# Patient Record
Sex: Female | Born: 1990 | Race: Black or African American | Hispanic: No | Marital: Single | Smoking: Former smoker
Health system: Southern US, Community
[De-identification: ages and names within clinical notes are randomized; demographics above are authoritative.]

## PROBLEM LIST (undated history)

## (undated) ENCOUNTER — Inpatient Hospital Stay (HOSPITAL_COMMUNITY): Payer: Self-pay

## (undated) DIAGNOSIS — R87629 Unspecified abnormal cytological findings in specimens from vagina: Secondary | ICD-10-CM

## (undated) DIAGNOSIS — K8020 Calculus of gallbladder without cholecystitis without obstruction: Secondary | ICD-10-CM

## (undated) DIAGNOSIS — B977 Papillomavirus as the cause of diseases classified elsewhere: Secondary | ICD-10-CM

## (undated) DIAGNOSIS — K649 Unspecified hemorrhoids: Secondary | ICD-10-CM

## (undated) DIAGNOSIS — A599 Trichomoniasis, unspecified: Secondary | ICD-10-CM

## (undated) DIAGNOSIS — I10 Essential (primary) hypertension: Secondary | ICD-10-CM

## (undated) DIAGNOSIS — D649 Anemia, unspecified: Secondary | ICD-10-CM

## (undated) HISTORY — DX: Essential (primary) hypertension: I10

## (undated) HISTORY — PX: EXCISION VAGINAL CYST: SHX5825

---

## 2012-03-13 DIAGNOSIS — B977 Papillomavirus as the cause of diseases classified elsewhere: Secondary | ICD-10-CM

## 2012-03-13 HISTORY — DX: Papillomavirus as the cause of diseases classified elsewhere: B97.7

## 2013-04-05 ENCOUNTER — Emergency Department (HOSPITAL_COMMUNITY)
Admission: EM | Admit: 2013-04-05 | Discharge: 2013-04-05 | Disposition: A | Discharge status: Home / Self Care | Payer: Self-pay | Attending: Emergency Medicine | Admitting: Emergency Medicine

## 2013-04-05 ENCOUNTER — Emergency Department (HOSPITAL_COMMUNITY): Payer: Self-pay

## 2013-04-05 ENCOUNTER — Encounter (HOSPITAL_COMMUNITY): Payer: Self-pay | Admitting: Emergency Medicine

## 2013-04-05 DIAGNOSIS — R109 Unspecified abdominal pain: Secondary | ICD-10-CM

## 2013-04-05 DIAGNOSIS — N760 Acute vaginitis: Secondary | ICD-10-CM | POA: Insufficient documentation

## 2013-04-05 DIAGNOSIS — Z3202 Encounter for pregnancy test, result negative: Secondary | ICD-10-CM | POA: Insufficient documentation

## 2013-04-05 DIAGNOSIS — R1031 Right lower quadrant pain: Secondary | ICD-10-CM | POA: Insufficient documentation

## 2013-04-05 DIAGNOSIS — A499 Bacterial infection, unspecified: Secondary | ICD-10-CM | POA: Insufficient documentation

## 2013-04-05 DIAGNOSIS — Z8719 Personal history of other diseases of the digestive system: Secondary | ICD-10-CM | POA: Insufficient documentation

## 2013-04-05 DIAGNOSIS — B9689 Other specified bacterial agents as the cause of diseases classified elsewhere: Secondary | ICD-10-CM | POA: Insufficient documentation

## 2013-04-05 DIAGNOSIS — R509 Fever, unspecified: Secondary | ICD-10-CM | POA: Insufficient documentation

## 2013-04-05 HISTORY — DX: Calculus of gallbladder without cholecystitis without obstruction: K80.20

## 2013-04-05 LAB — CBC WITH DIFFERENTIAL/PLATELET
BASOS ABS: 0 10*3/uL (ref 0.0–0.1)
Basophils Relative: 0 % (ref 0–1)
Eosinophils Absolute: 0.1 10*3/uL (ref 0.0–0.7)
Eosinophils Relative: 1 % (ref 0–5)
HCT: 35.6 % — ABNORMAL LOW (ref 36.0–46.0)
Hemoglobin: 12.1 g/dL (ref 12.0–15.0)
LYMPHS ABS: 3.1 10*3/uL (ref 0.7–4.0)
LYMPHS PCT: 29 % (ref 12–46)
MCH: 31.2 pg (ref 26.0–34.0)
MCHC: 34 g/dL (ref 30.0–36.0)
MCV: 91.8 fL (ref 78.0–100.0)
Monocytes Absolute: 0.6 10*3/uL (ref 0.1–1.0)
Monocytes Relative: 6 % (ref 3–12)
NEUTROS ABS: 6.9 10*3/uL (ref 1.7–7.7)
Neutrophils Relative %: 64 % (ref 43–77)
PLATELETS: 312 10*3/uL (ref 150–400)
RBC: 3.88 MIL/uL (ref 3.87–5.11)
RDW: 14.1 % (ref 11.5–15.5)
WBC: 10.7 10*3/uL — ABNORMAL HIGH (ref 4.0–10.5)

## 2013-04-05 LAB — URINE MICROSCOPIC-ADD ON

## 2013-04-05 LAB — URINALYSIS, ROUTINE W REFLEX MICROSCOPIC
BILIRUBIN URINE: NEGATIVE
GLUCOSE, UA: NEGATIVE mg/dL
Ketones, ur: NEGATIVE mg/dL
Nitrite: NEGATIVE
PROTEIN: NEGATIVE mg/dL
Specific Gravity, Urine: 1.035 — ABNORMAL HIGH (ref 1.005–1.030)
Urobilinogen, UA: 0.2 mg/dL (ref 0.0–1.0)
pH: 5.5 (ref 5.0–8.0)

## 2013-04-05 LAB — COMPREHENSIVE METABOLIC PANEL
ALT: 13 U/L (ref 0–35)
AST: 19 U/L (ref 0–37)
Albumin: 3.4 g/dL — ABNORMAL LOW (ref 3.5–5.2)
Alkaline Phosphatase: 95 U/L (ref 39–117)
BILIRUBIN TOTAL: 0.3 mg/dL (ref 0.3–1.2)
BUN: 14 mg/dL (ref 6–23)
CALCIUM: 8.5 mg/dL (ref 8.4–10.5)
CHLORIDE: 104 meq/L (ref 96–112)
CO2: 24 meq/L (ref 19–32)
Creatinine, Ser: 0.72 mg/dL (ref 0.50–1.10)
GFR calc non Af Amer: >90 mL/min (ref >90)
GLUCOSE: 108 mg/dL — AB (ref 70–99)
POTASSIUM: 3.7 meq/L (ref 3.7–5.3)
Sodium: 141 mEq/L (ref 137–147)
Total Protein: 7.5 g/dL (ref 6.0–8.3)

## 2013-04-05 LAB — WET PREP, GENITAL
TRICH WET PREP: NONE SEEN
Yeast Wet Prep HPF POC: NONE SEEN

## 2013-04-05 LAB — POCT PREGNANCY, URINE: PREG TEST UR: NEGATIVE

## 2013-04-05 LAB — LIPASE, BLOOD: Lipase: 24 U/L (ref 11–59)

## 2013-04-05 MED ORDER — MORPHINE SULFATE 4 MG/ML IJ SOLN
4.0000 mg | Freq: Once | INTRAMUSCULAR | Status: AC
  Administered 2013-04-05: 4 mg via INTRAVENOUS
  Filled 2013-04-05: qty 1

## 2013-04-05 MED ORDER — LIDOCAINE HCL (PF) 1 % IJ SOLN
INTRAMUSCULAR | Status: AC
  Filled 2013-04-05: qty 5

## 2013-04-05 MED ORDER — METRONIDAZOLE 500 MG PO TABS: 500.0000 mg | ORAL_TABLET | Freq: Two times a day (BID) | ORAL | Status: DC

## 2013-04-05 MED ORDER — AZITHROMYCIN 1 G PO PACK
1.0000 g | PACK | Freq: Once | ORAL | Status: AC
  Administered 2013-04-05: 1 g via ORAL
  Filled 2013-04-05: qty 1

## 2013-04-05 MED ORDER — HYDROCODONE-ACETAMINOPHEN 5-325 MG PO TABS: 2.0000 | ORAL_TABLET | ORAL | Status: DC | PRN

## 2013-04-05 MED ORDER — ONDANSETRON HCL 4 MG/2ML IJ SOLN
4.0000 mg | Freq: Once | INTRAMUSCULAR | Status: AC
  Administered 2013-04-05: 4 mg via INTRAVENOUS
  Filled 2013-04-05: qty 2

## 2013-04-05 MED ORDER — SODIUM CHLORIDE 0.9 % IV BOLUS (SEPSIS)
1000.0000 mL | Freq: Once | INTRAVENOUS | Status: AC
  Administered 2013-04-05: 1000 mL via INTRAVENOUS

## 2013-04-05 MED ORDER — CEFTRIAXONE SODIUM 250 MG IJ SOLR
250.0000 mg | Freq: Once | INTRAMUSCULAR | Status: AC
  Administered 2013-04-05: 250 mg via INTRAMUSCULAR
  Filled 2013-04-05: qty 250

## 2013-04-05 NOTE — ED Notes (Signed)
Pt states that she started experiencing abd pain at 0400. Denies N/V/D. Pt states that she thinks something is wrong with her gallbladder.

## 2013-04-05 NOTE — ED Notes (Signed)
Patient transported to CT 

## 2013-04-05 NOTE — ED Provider Notes (Signed)
Care assumed from Dr. Lavella LemonsManly. Patient awaiting pelvic ultrasound to evaluate for ovarian torsion.  Ultrasound shows no evidence of torsion. Patient's abdomen is soft and nontender. She stable for discharge per Dr. Lavella LemonsManly for treatment for bacterial vaginosis and cervicitis.  BP 105/42  Pulse 75  Temp(Src) 100 F (37.8 C) (Oral)  Resp 18  SpO2 99%  LMP 03/31/2013   Glynn OctaveStephen Marney Treloar, MD 04/05/13 1023

## 2013-04-05 NOTE — Discharge Instructions (Signed)
Abdominal Pain, Adult °Many things can cause abdominal pain. Usually, abdominal pain is not caused by a disease and will improve without treatment. It can often be observed and treated at home. Your health care provider will do a physical exam and possibly order blood tests and X-rays to help determine the seriousness of your pain. However, in many cases, more time must pass before a clear cause of the pain can be found. Before that point, your health care provider may not know if you need more testing or further treatment. °HOME CARE INSTRUCTIONS  °Monitor your abdominal pain for any changes. The following actions may help to alleviate any discomfort you are experiencing: °· Only take over-the-counter or prescription medicines as directed by your health care provider. °· Do not take laxatives unless directed to do so by your health care provider. °· Try a clear liquid diet (broth, tea, or water) as directed by your health care provider. Slowly move to a bland diet as tolerated. °SEEK MEDICAL CARE IF: °· You have unexplained abdominal pain. °· You have abdominal pain associated with nausea or diarrhea. °· You have pain when you urinate or have a bowel movement. °· You experience abdominal pain that wakes you in the night. °· You have abdominal pain that is worsened or improved by eating food. °· You have abdominal pain that is worsened with eating fatty foods. °SEEK IMMEDIATE MEDICAL CARE IF:  °· Your pain does not go away within 2 hours. °· You have a fever. °· You keep throwing up (vomiting). °· Your pain is felt only in portions of the abdomen, such as the right side or the left lower portion of the abdomen. °· You pass bloody or black tarry stools. °MAKE SURE YOU: °· Understand these instructions.   °· Will watch your condition.   °· Will get help right away if you are not doing well or get worse.   °Document Released: 12/07/2004 Document Revised: 12/18/2012 Document Reviewed: 11/06/2012 °[employer]® Patient  Information ©2014 [employer]. ° °

## 2013-04-05 NOTE — ED Notes (Signed)
Patient transported to Ultrasound 

## 2013-04-05 NOTE — ED Provider Notes (Signed)
CSN: [id number]     Arrival date & time 04/05/13  0518 History   First MD Initiated Contact with Patient 04/05/13 [id number]     Chief Complaint  Patient presents with  . Groin Pain   (Consider location/radiation/quality/duration/timing/severity/associated sxs/prior Treatment) HPI This patient is a 23 year old woman in generally good health. She presents with complaints of right lower quadrant abdominal pain radiating to the suprapubic region. She is feeling well when she went to sleep last night. She awoke around 4 AM with pain. She denies both nausea and vomiting. She has urinated since her symptoms began and has not appreciated dysuria, hesitancy or gross hematuria.  She denies history of similar symptoms. She has no history of abdominal surgeries. She has not had diarrhea. She denies unusual vaginal discharge. Her last menstrual period ended 5 days ago.  The patient denies fever at home. She is noted to have a mildly elevated temperature here in the emergency department 100.0 Fahrenheit  Past Medical History  Diagnosis Date  . Gallstones    History reviewed. No pertinent past surgical history. No family history on file. History  Substance Use Topics  . Smoking status: Never Smoker   . Smokeless tobacco: Never Used  . Alcohol Use: Not on file     Comment: Ocassionaly   OB History   Grav Para Term Preterm Abortions TAB SAB Ect Mult Living   1 1 1       1      Review of Systems Ten point review of symptoms performed and is negative with the exception of symptoms noted above.   Allergies  Review of patient's allergies indicates no known allergies.  Home Medications  No current outpatient prescriptions on file. BP 143/55  Pulse 105  Temp(Src) 100 F (37.8 C) (Oral)  Resp 18  SpO2 100%  LMP 03/31/2013 Physical Exam Gen: well developed and well nourished appearing, tearful and appears to be in pain Head: NCAT Eyes: PERL, EOMI Nose: no epistaixis or rhinorrhea Mouth/throat:  mucosa is moist and pink Neck: supple, no stridor Lungs: Respiratory 24 permanent, CTA B, no wheezing, rhonchi or rales CV: Rapid and regular, pulse 104 beats per minute no murmur, extremities appear well perfused.  Abd: soft, tenderness over the right lower quadrant, more significant tenderness over the midline suprapubic region, nondistended Back: no ttp, no cva ttp Skin: warm and dry Ext: normal to inspection, no dependent edema Neuro: CN ii-xii grossly intact, no focal deficits Psyche; anxious appearing affect, cooperative.   ED Course  Procedures (including critical care time) Labs Review  Results for orders placed during the hospital encounter of 04/05/13 (from the past 24 hour(s))  CBC WITH DIFFERENTIAL     Status: Abnormal   Collection Time    04/05/13  5:33 AM      Result Value Range   WBC 10.7 (*) 4.0 - 10.5 K/uL   RBC 3.88  3.87 - 5.11 MIL/uL   Hemoglobin 12.1  12.0 - 15.0 g/dL   HCT 56.235.6 (*) 13.036.0 - 86.546.0 %   MCV 91.8  78.0 - 100.0 fL   MCH 31.2  26.0 - 34.0 pg   MCHC 34.0  30.0 - 36.0 g/dL   RDW 78.414.1  69.611.5 - 29.515.5 %   Platelets 312  150 - 400 K/uL   Neutrophils Relative % 64  43 - 77 %   Neutro Abs 6.9  1.7 - 7.7 K/uL   Lymphocytes Relative 29  12 - 46 %   Lymphs Abs 3.1  0.7 - 4.0 K/uL   Monocytes Relative 6  3 - 12 %   Monocytes Absolute 0.6  0.1 - 1.0 K/uL   Eosinophils Relative 1  0 - 5 %   Eosinophils Absolute 0.1  0.0 - 0.7 K/uL   Basophils Relative 0  0 - 1 %   Basophils Absolute 0.0  0.0 - 0.1 K/uL  COMPREHENSIVE METABOLIC PANEL     Status: Abnormal   Collection Time    04/05/13  5:33 AM      Result Value Range   Sodium 141  137 - 147 mEq/L   Potassium 3.7  3.7 - 5.3 mEq/L   Chloride 104  96 - 112 mEq/L   CO2 24  19 - 32 mEq/L   Glucose, Bld 108 (*) 70 - 99 mg/dL   BUN 14  6 - 23 mg/dL   Creatinine, Ser 6.57  0.50 - 1.10 mg/dL   Calcium 8.5  8.4 - 84.6 mg/dL   Total Protein 7.5  6.0 - 8.3 g/dL   Albumin 3.4 (*) 3.5 - 5.2 g/dL   AST 19  0 - 37 U/L    ALT 13  0 - 35 U/L   Alkaline Phosphatase 95  39 - 117 U/L   Total Bilirubin 0.3  0.3 - 1.2 mg/dL   GFR calc non Af Amer >90  >90 mL/min   GFR calc Af Amer >90  >90 mL/min  LIPASE, BLOOD     Status: None   Collection Time    04/05/13  5:33 AM      Result Value Range   Lipase 24  11 - 59 U/L  URINALYSIS, ROUTINE W REFLEX MICROSCOPIC     Status: Abnormal   Collection Time    04/05/13  5:41 AM      Result Value Range   Color, Urine YELLOW  YELLOW   APPearance CLOUDY (*) CLEAR   Specific Gravity, Urine 1.035 (*) 1.005 - 1.030   pH 5.5  5.0 - 8.0   Glucose, UA NEGATIVE  NEGATIVE mg/dL   Hgb urine dipstick MODERATE (*) NEGATIVE   Bilirubin Urine NEGATIVE  NEGATIVE   Ketones, ur NEGATIVE  NEGATIVE mg/dL   Protein, ur NEGATIVE  NEGATIVE mg/dL   Urobilinogen, UA 0.2  0.0 - 1.0 mg/dL   Nitrite NEGATIVE  NEGATIVE   Leukocytes, UA MODERATE (*) NEGATIVE  URINE MICROSCOPIC-ADD ON     Status: Abnormal   Collection Time    04/05/13  5:41 AM      Result Value Range   Squamous Epithelial / LPF FEW (*) RARE   WBC, UA 3-6  <3 WBC/hpf   RBC / HPF 0-2  <3 RBC/hpf   Bacteria, UA FEW (*) RARE  POCT PREGNANCY, URINE     Status: None   Collection Time    04/05/13  5:53 AM      Result Value Range   Preg Test, Ur NEGATIVE  NEGATIVE    MDM  DDX: cervicitis, PID, complication of pregnancy, ovarian torsion, appendicitis, UTI, pyelonephritis, colitis.   U/A is nondiagnostic and POC urine preg is negative. We will perform pelvic exam and proceed with plan to CT abd/pelvis and evaluate for appendicitis if pelvic exam is non-diagnostic. We are treating with ivf, analgesia and antiemetic.   0630: Pelvic exam performed. Normal female external genitalia and vulva without lesion, no vaginal discharge, endocervical swabs obtained, no CMT, no adnexal masses appreciated on bimanual exam.   [phone]: Patient says pain is better  but still significant. Rates in 7/10.   0743:  CT abdomen pelvis is wnl. Wet prep  consistent with bacterial vaginosis. The patient is feeling better.   Brandt Loosen, MD 04/05/13 [phone]

## 2013-04-06 LAB — URINE CULTURE: Colony Count: >=100000

## 2013-04-07 LAB — GC/CHLAMYDIA PROBE AMP
CT PROBE, AMP APTIMA: NEGATIVE
GC PROBE AMP APTIMA: NEGATIVE

## 2014-01-12 ENCOUNTER — Encounter (HOSPITAL_COMMUNITY): Payer: Self-pay | Admitting: Emergency Medicine

## 2015-08-14 IMAGING — US US ART/VEN ABD/PELV/SCROTUM DOPPLER LTD
1 series · 13 of 25 positions shown · non-contrast
Comparison: CT same date

ADDENDUM:
The title of this examination should also include: TRANSVAGINAL
PELVIC ULTRASOUND

In the explanation of technique, it should be specified that
transvaginal technique was utilized for improved visualization of
the uterus and adnexal structures and was specifically necessary for
improved visualization of the endometrium and ovaries.
CLINICAL DATA: Groin and pelvic pain, side not specified, evaluate
for ovarian torsion
EXAM:
TRANSABDOMINAL ULTRASOUND OF PELVIS
DOPPLER ULTRASOUND OF OVARIES
TECHNIQUE: Transabdominal ultrasound examination of the pelvis was performed
including evaluation of the uterus, ovaries, adnexal regions, and
pelvic cul-de-sac.
Color and duplex Doppler ultrasound was utilized to evaluate blood
flow to the ovaries.

[Series 1: us art/ven abd/pelv/scrotum doppler ltd · 0.22mm/px · 13 of 55 slices shown]
[im 1/55]
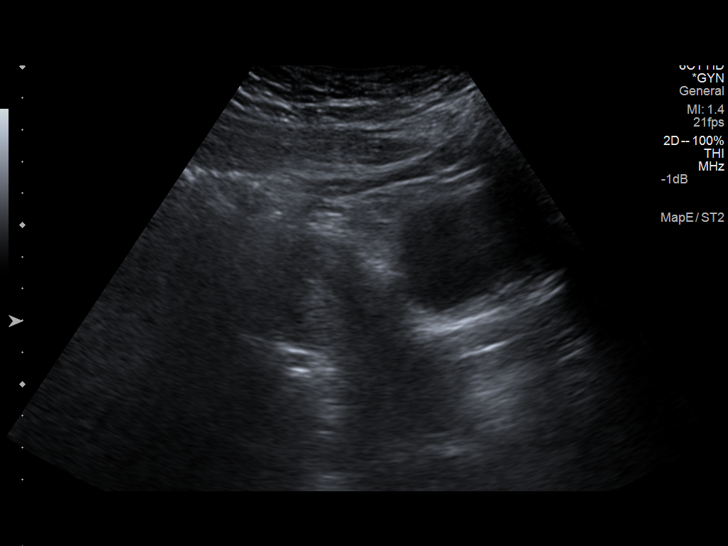
[im 5/55]
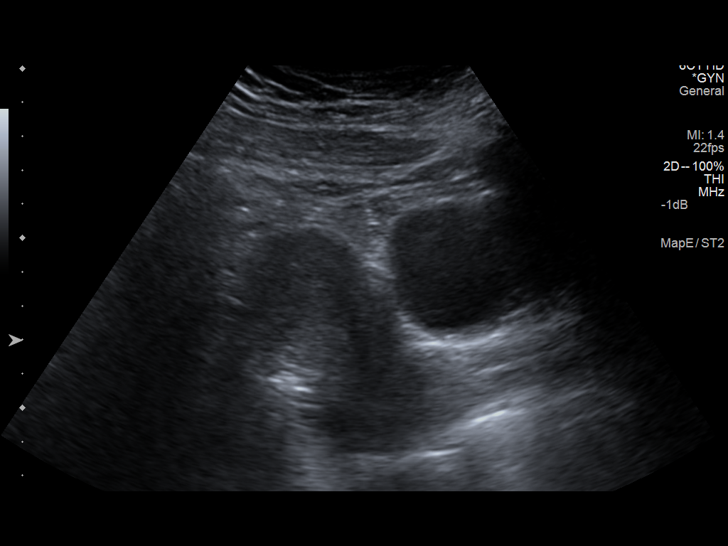
[im 10/55]
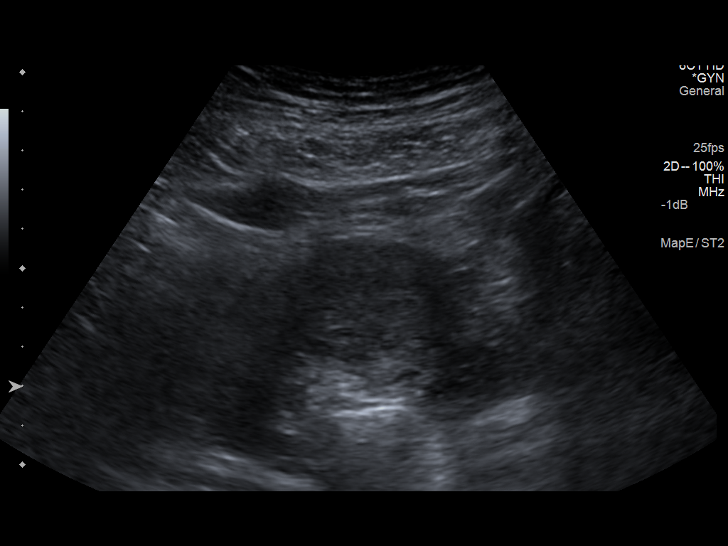
[im 14/55]
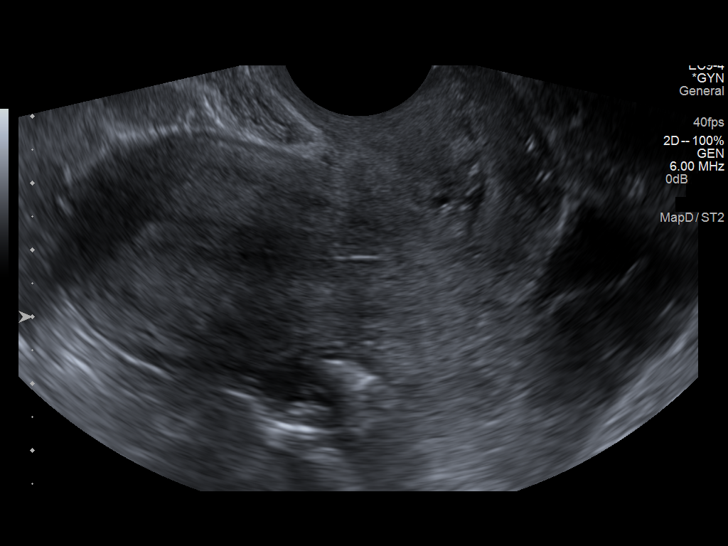
[im 19/55]
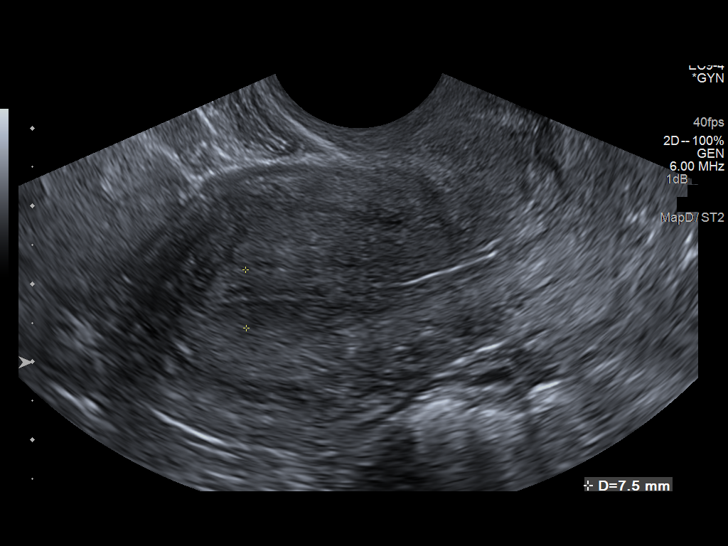
[im 23/55]
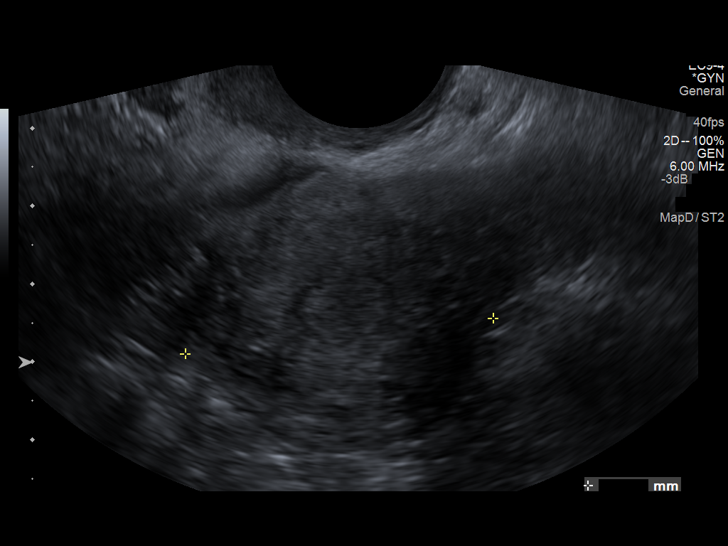
[im 28/55]
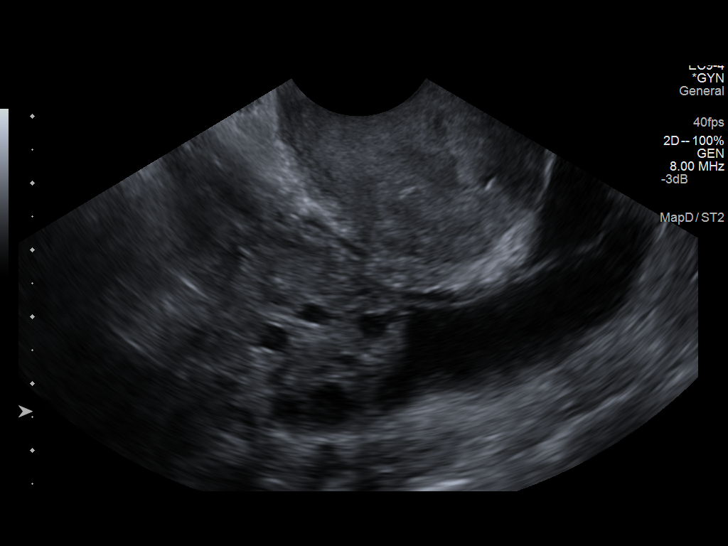
[im 32/55]
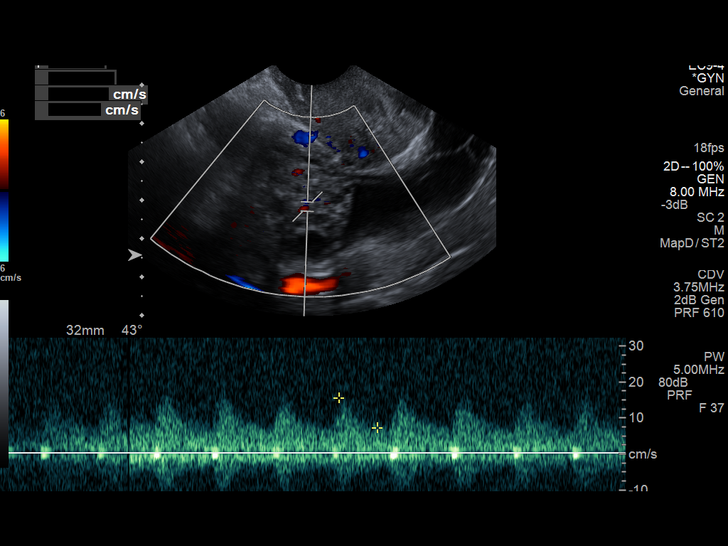
[im 37/55]
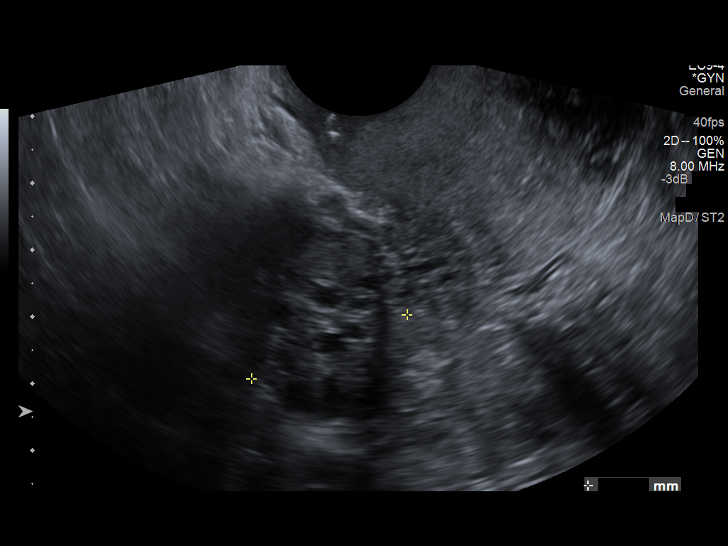
[im 41/55]
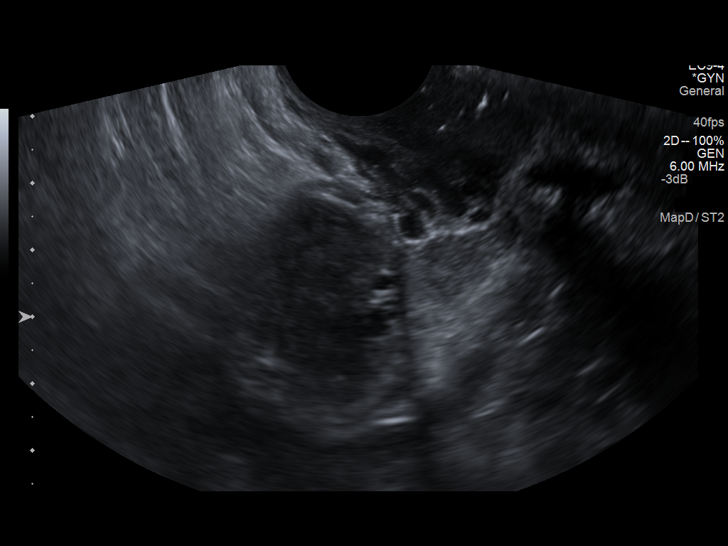
[im 46/55]
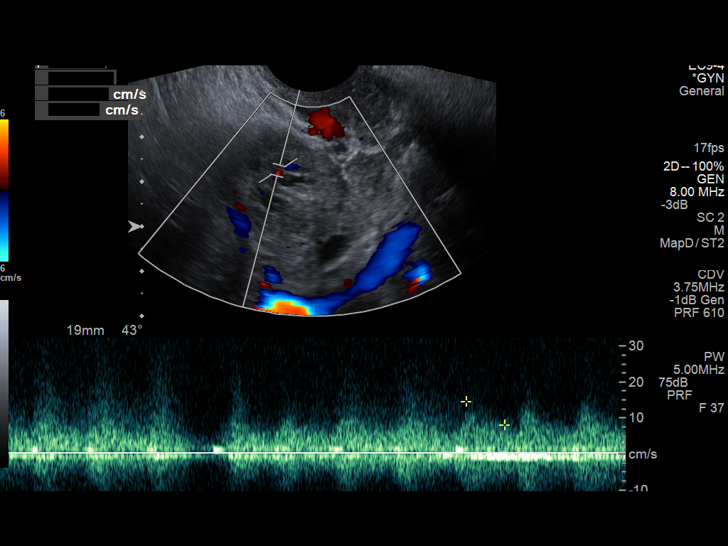
[im 50/55]
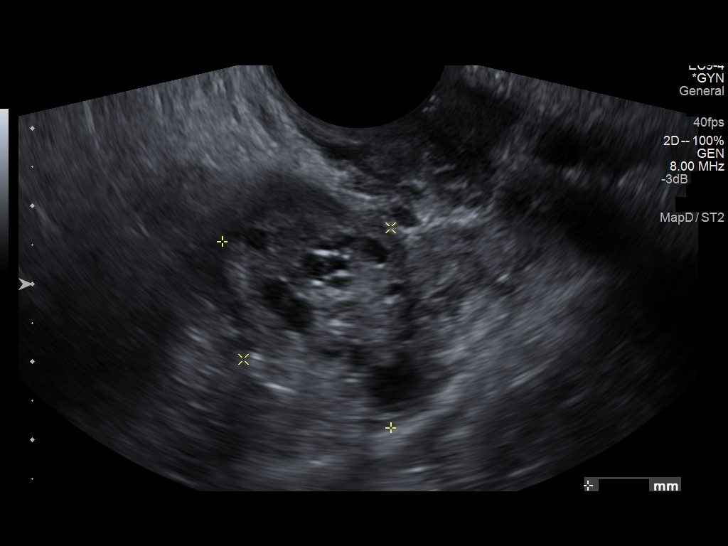
[im 55/55]
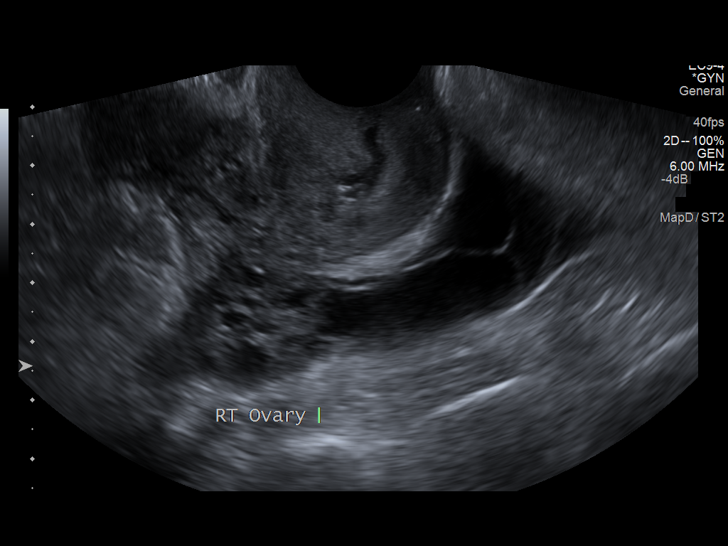

[13 of 25 positions shown; findings below may reference images not displayed]

FINDINGS: Uterus

Measurements: 7.5 x 4.0 x 3.6 cm. Anteverted, anteflexed. No focal
abnormality.

Endometrium

Thickness: 8 mm. Trilaminar in appearance without focal abnormality.

Right ovary

Measurements: 3.3 x 2.5 x 2.4 cm. Normal appearance/no adnexal mass.

Left ovary

Measurements: 3.2 x 2.5 x 2.0 cm. Normal appearance/no adnexal mass.

Pulsed Doppler evaluation demonstrates normal low-resistance
arterial and venous waveforms in both ovaries. Aliasing artifact is
noted on both sides, and discussion with the technologist revealed
fat at real time imaging, clear arterial and venous waveforms were
visualized bilaterally.

Small amount of free fluid with internal thin linear accessed noted
in the cul de sac.
IMPRESSION: No acute intrapelvic abnormality. Specifically, no sonographic
evidence for ovarian torsion. A small amount of fluid in the
cul-de-sac demonstrates thin internal linear echoes that may
indicate resolving blood product, although this appearance may be
seen with pelvic inflammatory disease in the appropriate clinical
context.

## 2015-12-22 LAB — OB RESULTS CONSOLE ABO/RH: RH TYPE: POSITIVE

## 2015-12-22 LAB — OB RESULTS CONSOLE RPR: RPR: NONREACTIVE

## 2015-12-22 LAB — OB RESULTS CONSOLE ANTIBODY SCREEN: ANTIBODY SCREEN: NEGATIVE

## 2015-12-22 LAB — OB RESULTS CONSOLE HIV ANTIBODY (ROUTINE TESTING): HIV: NONREACTIVE

## 2015-12-22 LAB — OB RESULTS CONSOLE RUBELLA ANTIBODY, IGM: Rubella: IMMUNE

## 2015-12-22 LAB — OB RESULTS CONSOLE GC/CHLAMYDIA
Chlamydia: NEGATIVE
Gonorrhea: NEGATIVE

## 2015-12-22 LAB — OB RESULTS CONSOLE HEPATITIS B SURFACE ANTIGEN: Hepatitis B Surface Ag: NEGATIVE

## 2016-03-13 NOTE — L&D Delivery Note (Signed)
26 y.o. G2P1001 at 6687w4d delivered a viable female infant in cephalic, LOA position. no nuchal cord.  anterior shoulder delivered with ease. 90 sec delayed cord clamping. Cord clamped x2 and cut. Placenta delivered spontaneously intact, with 3VC. Fundus firm on exam with massage and pitocin. Good hemostasis noted.  Anesthesia: Epidural Laceration: None, no sutures required Good hemostasis noted. EBL: 100 cc  Mom and baby recovering in LDR.    Weight: Pending skin to skin  Sponge and instrument count were correct x2. Placenta sent to L&D pathology.  Howard PouchLauren Feng, MD PGY-1 Family Medicine 07/27/2016, 7:13 PM   OB FELLOW DELIVERY ATTESTATION  I was gloved and present for the delivery in its entirety, and I agree with the above resident's note.    Jen MowElizabeth Sandor Arboleda, DO OB Fellow

## 2016-06-28 ENCOUNTER — Inpatient Hospital Stay (HOSPITAL_COMMUNITY)
Admission: AD | Admit: 2016-06-28 | Discharge: 2016-06-29 | Disposition: A | Discharge status: Home / Self Care | Payer: Medicaid Other | Source: Ambulatory Visit | Attending: Obstetrics & Gynecology | Admitting: Obstetrics & Gynecology

## 2016-06-28 ENCOUNTER — Encounter (HOSPITAL_COMMUNITY): Payer: Self-pay

## 2016-06-28 DIAGNOSIS — O479 False labor, unspecified: Secondary | ICD-10-CM | POA: Insufficient documentation

## 2016-06-28 DIAGNOSIS — Z3A00 Weeks of gestation of pregnancy not specified: Secondary | ICD-10-CM | POA: Diagnosis not present

## 2016-06-28 LAB — URINALYSIS, ROUTINE W REFLEX MICROSCOPIC
GLUCOSE, UA: NEGATIVE mg/dL
Ketones, ur: 5 mg/dL — AB
NITRITE: NEGATIVE
Protein, ur: 30 mg/dL — AB
Specific Gravity, Urine: 1.035 — ABNORMAL HIGH (ref 1.005–1.030)
pH: 5 (ref 5.0–8.0)

## 2016-06-28 NOTE — MAU Note (Signed)
PT  SAYS SHE  HAD  VAG SPOTTING  AT 1015-  WHEN  SHE  WIPED.     NO VE  AT HD.   LAST SEX-  LAST WEEK.    FEELS  SOME  UC.

## 2016-06-28 NOTE — MAU Note (Signed)
Pt here with c/o spotting this evening. Denies any leaking of fluid but reports some vaginal itching. Reports positive fetal movement.

## 2016-06-29 ENCOUNTER — Encounter: Payer: Self-pay | Admitting: Cardiovascular Disease

## 2016-06-29 ENCOUNTER — Ambulatory Visit (INDEPENDENT_AMBULATORY_CARE_PROVIDER_SITE_OTHER): Payer: Medicaid Other | Admitting: Cardiovascular Disease

## 2016-06-29 DIAGNOSIS — R002 Palpitations: Secondary | ICD-10-CM | POA: Diagnosis not present

## 2016-06-29 DIAGNOSIS — O479 False labor, unspecified: Secondary | ICD-10-CM | POA: Diagnosis not present

## 2016-06-29 LAB — OB RESULTS CONSOLE GC/CHLAMYDIA
Chlamydia: NEGATIVE
GC PROBE AMP, GENITAL: NEGATIVE

## 2016-06-29 LAB — OB RESULTS CONSOLE GBS: STREP GROUP B AG: POSITIVE

## 2016-06-29 NOTE — Discharge Instructions (Signed)

## 2016-06-29 NOTE — Assessment & Plan Note (Signed)
Monica Jimenez is a 26 year old severely overweight single African-American female mother of one 84-year-old child only [redacted] weeks pregnant referred by the Department of Public health for evaluation of an irregular heart heartbeat. She has no family doctor. She recently relocated to [place] 3 months ago. No risk factors other than a father who had a stent at age 58. Prior to her pregnancy she did drink a significant amount of alcohol on the weekends. She is on no medications. She's never had a heart attack or stroke. She denies palpitations. She was seen in the Public health Department today and on exam the physician heard a "irregular heart rhythm and referred her here for further evaluation. She does not feel these. Her EKG today shows sinus tachycardia with PACs. I do not think she needs further evaluation for these at the at this time. I suspect these will resolve spontaneously after she gets birth. She'll see mid-level provider back in 3 months and back. After that.

## 2016-06-29 NOTE — Patient Instructions (Signed)
Medication Instructions:  Your physician recommends that you continue on your current medications as directed. Please refer to the Current Medication list given to you today.  Labwork: NONE  Testing/Procedures: NONE  Follow-Up: Your physician recommends that you schedule a follow-up appointment in: 3 MONTHS with APP-then AS NEEDED.   Any Other Special Instructions Will Be Listed Below (If Applicable).     If you need a refill on your cardiac medications before your next appointment, please call your pharmacy.

## 2016-06-29 NOTE — MAU Note (Signed)
I have communicated with Erin L , NP and reviewed vital signs:  Vitals:   06/28/16 2256  BP: 117/69  Pulse: (!) 112  Resp: 20  Temp: 98 F (36.7 C)    Vaginal exam:  Dilation: 1 Effacement (%): Thick Station: -2 Presentation: Vertex Exam by:: DCALLAWAY, RN  ,   Also reviewed contraction pattern and that non-stress test is reactive.  It has been documented that patient is not contracting and vaginal exam is 1 cm  not indicating active labor.  Patient denies any other complaints.  Based on this report provider has given order for discharge.  A discharge order and diagnosis entered by a provider.   Labor discharge instructions reviewed with patient.

## 2016-06-29 NOTE — Progress Notes (Signed)
06/29/2016 Monica Jimenez   1990-04-11  [id number]  Primary Physician No PCP Per Patient Primary Cardiologist: Runell Gess MD Roseanne Reno  HPI:  Ms. Morris is a 26 year old severely overweight single African-American female mother of one 61-year-old child only [redacted] weeks pregnant referred by the Department of Public health for evaluation of an irregular heart heartbeat. She has no family doctor. She recently relocated to [place] 3 months ago. No risk factors other than a father who had a stent at age 51. Prior to her pregnancy she did drink a significant amount of alcohol on the weekends. She is on no medications. She's never had a heart attack or stroke. She denies palpitations. She was seen in the Public health Department today and on exam the physician heard a "irregular heart rhythm and referred her here for further evaluation. She does not feel these. Her EKG today shows sinus tachycardia with PACs. I do not think she needs further evaluation for these at the at this time. I suspect these will resolve spontaneously after she gets birth. She'll see mid-level provider back in 3 months and back when necessary after that.   Current Outpatient Prescriptions  Medication Sig Dispense Refill  . Prenat-FeFum-FePo-FA-Omega 3 (TARON-C DHA) 53.5-38-1 MG CAPS Take 1 capsule by mouth daily.  12   No current facility-administered medications for this visit.     No Known Allergies  Social History   Social History  . Marital status: Single    Spouse name: N/A  . Number of children: N/A  . Years of education: N/A   Occupational History  . Not on file.   Social History Main Topics  . Smoking status: Never Smoker  . Smokeless tobacco: Never Used  . Alcohol use Not on file     Comment: Ocassionaly  . Drug use: No  . Sexual activity: Not on file   Other Topics Concern  . Not on file   Social History Narrative  . No narrative on file     Review of  Systems: General: negative for chills, fever, night sweats or weight changes.  Cardiovascular: negative for chest pain, dyspnea on exertion, edema, orthopnea, palpitations, paroxysmal nocturnal dyspnea or shortness of breath Dermatological: negative for rash Respiratory: negative for cough or wheezing Urologic: negative for hematuria Abdominal: negative for nausea, vomiting, diarrhea, bright red blood per rectum, melena, or hematemesis Neurologic: negative for visual changes, syncope, or dizziness All other systems reviewed and are otherwise negative except as noted above.    Blood pressure 126/74, pulse (!) 116, height  (1.549 m), weight 280 lb 3.2 oz (127.1 kg).  General appearance: alert and no distress Neck: no adenopathy, no carotid bruit, no JVD, supple, symmetrical, trachea midline and thyroid not enlarged, symmetric, no tenderness/mass/nodules Lungs: clear to auscultation bilaterally Heart: regular rate and rhythm, S1, S2 normal, no murmur, click, rub or gallop Extremities: extremities normal, atraumatic, no cyanosis or edema  EKG sinus tachycardia 116 with occasional PACs and nonspecific ST and T-wave changes. I personally reviewed this EKG  ASSESSMENT AND PLAN:   Palpitations Ms. Beightol is a 26 year old severely overweight single African-American female mother of one 43-year-old child only [redacted] weeks pregnant referred by the Department of Public health for evaluation of an irregular heart heartbeat. She has no family doctor. She recently relocated to [place] 3 months ago. No risk factors other than a father who had a stent at age 72. Prior to her pregnancy she did drink a significant  amount of alcohol on the weekends. She is on no medications. She's never had a heart attack or stroke. She denies palpitations. She was seen in the Public health Department today and on exam the physician heard a "irregular heart rhythm and referred her here for further evaluation. She does not  feel these. Her EKG today shows sinus tachycardia with PACs. I do not think she needs further evaluation for these at the at this time. I suspect these will resolve spontaneously after she gets birth. She'll see mid-level provider back in 3 months and back. After that.      Runell Gess MD FACP,FACC,FAHA, [facility] 06/29/2016 11:55 AM

## 2016-07-25 ENCOUNTER — Encounter (HOSPITAL_COMMUNITY): Payer: Self-pay | Admitting: *Deleted

## 2016-07-25 ENCOUNTER — Telehealth (HOSPITAL_COMMUNITY): Payer: Self-pay | Admitting: *Deleted

## 2016-07-25 NOTE — Telephone Encounter (Signed)
Preadmission screen  

## 2016-07-27 ENCOUNTER — Inpatient Hospital Stay (HOSPITAL_COMMUNITY): Payer: Medicaid Other | Admitting: Anesthesiology

## 2016-07-27 ENCOUNTER — Other Ambulatory Visit: Payer: Self-pay | Admitting: Advanced Practice Midwife

## 2016-07-27 ENCOUNTER — Inpatient Hospital Stay (HOSPITAL_COMMUNITY)
Admission: AD | Admit: 2016-07-27 | Discharge: 2016-07-29 | DRG: 775 | Disposition: A | Discharge status: Home / Self Care | Payer: Medicaid Other | Source: Ambulatory Visit | Attending: Obstetrics & Gynecology | Admitting: Obstetrics & Gynecology

## 2016-07-27 ENCOUNTER — Encounter (HOSPITAL_COMMUNITY): Payer: Self-pay

## 2016-07-27 DIAGNOSIS — O99824 Streptococcus B carrier state complicating childbirth: Principal | ICD-10-CM | POA: Diagnosis present

## 2016-07-27 DIAGNOSIS — O479 False labor, unspecified: Secondary | ICD-10-CM | POA: Diagnosis present

## 2016-07-27 DIAGNOSIS — O99214 Obesity complicating childbirth: Secondary | ICD-10-CM | POA: Diagnosis present

## 2016-07-27 DIAGNOSIS — Z3493 Encounter for supervision of normal pregnancy, unspecified, third trimester: Secondary | ICD-10-CM | POA: Diagnosis present

## 2016-07-27 DIAGNOSIS — Z3A40 40 weeks gestation of pregnancy: Secondary | ICD-10-CM

## 2016-07-27 DIAGNOSIS — Z6841 Body Mass Index (BMI) 40.0 and over, adult: Secondary | ICD-10-CM

## 2016-07-27 HISTORY — DX: Morbid (severe) obesity due to excess calories: E66.01

## 2016-07-27 LAB — TYPE AND SCREEN
ABO/RH(D): O POS
ANTIBODY SCREEN: NEGATIVE

## 2016-07-27 LAB — CBC
HEMATOCRIT: 33 % — AB (ref 36.0–46.0)
HEMOGLOBIN: 10.9 g/dL — AB (ref 12.0–15.0)
MCH: 30.3 pg (ref 26.0–34.0)
MCHC: 33 g/dL (ref 30.0–36.0)
MCV: 91.7 fL (ref 78.0–100.0)
Platelets: 252 10*3/uL (ref 150–400)
RBC: 3.6 MIL/uL — ABNORMAL LOW (ref 3.87–5.11)
RDW: 14.4 % (ref 11.5–15.5)
WBC: 11 10*3/uL — ABNORMAL HIGH (ref 4.0–10.5)

## 2016-07-27 LAB — ABO/RH: ABO/RH(D): O POS

## 2016-07-27 MED ORDER — PHENYLEPHRINE 40 MCG/ML (10ML) SYRINGE FOR IV PUSH (FOR BLOOD PRESSURE SUPPORT)
80.0000 ug | PREFILLED_SYRINGE | INTRAVENOUS | Status: DC | PRN
  Filled 2016-07-27: qty 10
  Filled 2016-07-27: qty 5

## 2016-07-27 MED ORDER — LIDOCAINE HCL (PF) 1 % IJ SOLN
30.0000 mL | INTRAMUSCULAR | Status: DC | PRN
  Filled 2016-07-27: qty 30

## 2016-07-27 MED ORDER — TERBUTALINE SULFATE 1 MG/ML IJ SOLN
0.2500 mg | Freq: Once | INTRAMUSCULAR | Status: DC | PRN
  Filled 2016-07-27: qty 1

## 2016-07-27 MED ORDER — SOD CITRATE-CITRIC ACID 500-334 MG/5ML PO SOLN: 30.0000 mL | ORAL | Status: DC | PRN

## 2016-07-27 MED ORDER — DIPHENHYDRAMINE HCL 50 MG/ML IJ SOLN: 12.5000 mg | INTRAMUSCULAR | Status: DC | PRN

## 2016-07-27 MED ORDER — IBUPROFEN 600 MG PO TABS
600.0000 mg | ORAL_TABLET | Freq: Four times a day (QID) | ORAL | Status: DC
  Administered 2016-07-28 – 2016-07-29 (×5): 600 mg via ORAL
  Filled 2016-07-27 (×7): qty 1

## 2016-07-27 MED ORDER — PHENYLEPHRINE 40 MCG/ML (10ML) SYRINGE FOR IV PUSH (FOR BLOOD PRESSURE SUPPORT)
80.0000 ug | PREFILLED_SYRINGE | INTRAVENOUS | Status: DC | PRN
  Filled 2016-07-27: qty 5

## 2016-07-27 MED ORDER — WITCH HAZEL-GLYCERIN EX PADS: 1.0000 "application " | MEDICATED_PAD | CUTANEOUS | Status: DC | PRN

## 2016-07-27 MED ORDER — OXYTOCIN 40 UNITS IN LACTATED RINGERS INFUSION - SIMPLE MED
1.0000 m[IU]/min | INTRAVENOUS | Status: DC
  Administered 2016-07-27: 2 m[IU]/min via INTRAVENOUS

## 2016-07-27 MED ORDER — EPHEDRINE 5 MG/ML INJ
10.0000 mg | INTRAVENOUS | Status: DC | PRN
  Filled 2016-07-27: qty 2

## 2016-07-27 MED ORDER — ZOLPIDEM TARTRATE 5 MG PO TABS: 5.0000 mg | ORAL_TABLET | Freq: Every evening | ORAL | Status: DC | PRN

## 2016-07-27 MED ORDER — DIPHENHYDRAMINE HCL 25 MG PO CAPS: 25.0000 mg | ORAL_CAPSULE | Freq: Four times a day (QID) | ORAL | Status: DC | PRN

## 2016-07-27 MED ORDER — FENTANYL CITRATE (PF) 100 MCG/2ML IJ SOLN
100.0000 ug | INTRAMUSCULAR | Status: DC | PRN
  Administered 2016-07-27 (×2): 100 ug via INTRAVENOUS
  Filled 2016-07-27 (×2): qty 2

## 2016-07-27 MED ORDER — FENTANYL 2.5 MCG/ML BUPIVACAINE 1/10 % EPIDURAL INFUSION (WH - ANES)
14.0000 mL/h | INTRAMUSCULAR | Status: DC | PRN
  Administered 2016-07-27 (×2): 14 mL/h via EPIDURAL
  Filled 2016-07-27 (×2): qty 100

## 2016-07-27 MED ORDER — LACTATED RINGERS IV SOLN
500.0000 mL | INTRAVENOUS | Status: DC | PRN
  Administered 2016-07-27: 250 mL via INTRAVENOUS

## 2016-07-27 MED ORDER — ONDANSETRON HCL 4 MG/2ML IJ SOLN: 4.0000 mg | Freq: Four times a day (QID) | INTRAMUSCULAR | Status: DC | PRN

## 2016-07-27 MED ORDER — SENNOSIDES-DOCUSATE SODIUM 8.6-50 MG PO TABS
2.0000 | ORAL_TABLET | ORAL | Status: DC
  Administered 2016-07-28: 2 via ORAL
  Filled 2016-07-27 (×2): qty 2

## 2016-07-27 MED ORDER — PRENATAL MULTIVITAMIN CH
1.0000 | ORAL_TABLET | Freq: Every day | ORAL | Status: DC
  Administered 2016-07-28 – 2016-07-29 (×2): 1 via ORAL
  Filled 2016-07-27 (×2): qty 1

## 2016-07-27 MED ORDER — OXYCODONE-ACETAMINOPHEN 5-325 MG PO TABS: 1.0000 | ORAL_TABLET | ORAL | Status: DC | PRN

## 2016-07-27 MED ORDER — ACETAMINOPHEN 325 MG PO TABS
650.0000 mg | ORAL_TABLET | ORAL | Status: DC | PRN
Start: 2016-07-27 — End: 2016-07-27
  Administered 2016-07-27: 650 mg via ORAL
  Filled 2016-07-27: qty 2

## 2016-07-27 MED ORDER — LIDOCAINE HCL (PF) 1 % IJ SOLN
INTRAMUSCULAR | Status: DC | PRN
  Administered 2016-07-27 (×2): 5 mL

## 2016-07-27 MED ORDER — ONDANSETRON HCL 4 MG PO TABS: 4.0000 mg | ORAL_TABLET | ORAL | Status: DC | PRN

## 2016-07-27 MED ORDER — SIMETHICONE 80 MG PO CHEW
80.0000 mg | CHEWABLE_TABLET | ORAL | Status: DC | PRN
Start: 2016-07-27 — End: 2016-07-29

## 2016-07-27 MED ORDER — OXYTOCIN 40 UNITS IN LACTATED RINGERS INFUSION - SIMPLE MED
2.5000 [IU]/h | INTRAVENOUS | Status: DC
  Administered 2016-07-27 (×2): 2.5 [IU]/h via INTRAVENOUS
  Filled 2016-07-27: qty 1000

## 2016-07-27 MED ORDER — ACETAMINOPHEN 325 MG PO TABS: 650.0000 mg | ORAL_TABLET | ORAL | Status: DC | PRN

## 2016-07-27 MED ORDER — DIBUCAINE 1 % RE OINT: 1.0000 "application " | TOPICAL_OINTMENT | RECTAL | Status: DC | PRN

## 2016-07-27 MED ORDER — OXYTOCIN BOLUS FROM INFUSION
500.0000 mL | Freq: Once | INTRAVENOUS | Status: AC
  Administered 2016-07-27: 500 mL via INTRAVENOUS

## 2016-07-27 MED ORDER — FLEET ENEMA 7-19 GM/118ML RE ENEM: 1.0000 | ENEMA | RECTAL | Status: DC | PRN

## 2016-07-27 MED ORDER — COCONUT OIL OIL: 1.0000 "application " | TOPICAL_OIL | Status: DC | PRN

## 2016-07-27 MED ORDER — TETANUS-DIPHTH-ACELL PERTUSSIS 5-2.5-18.5 LF-MCG/0.5 IM SUSP: 0.5000 mL | Freq: Once | INTRAMUSCULAR | Status: DC

## 2016-07-27 MED ORDER — PENICILLIN G POT IN DEXTROSE 60000 UNIT/ML IV SOLN
3.0000 10*6.[IU] | INTRAVENOUS | Status: DC
  Administered 2016-07-27 (×3): 3 10*6.[IU] via INTRAVENOUS
  Filled 2016-07-27 (×5): qty 50

## 2016-07-27 MED ORDER — BENZOCAINE-MENTHOL 20-0.5 % EX AERO: 1.0000 "application " | INHALATION_SPRAY | CUTANEOUS | Status: DC | PRN

## 2016-07-27 MED ORDER — OXYCODONE-ACETAMINOPHEN 5-325 MG PO TABS: 2.0000 | ORAL_TABLET | ORAL | Status: DC | PRN

## 2016-07-27 MED ORDER — ONDANSETRON HCL 4 MG/2ML IJ SOLN: 4.0000 mg | INTRAMUSCULAR | Status: DC | PRN

## 2016-07-27 MED ORDER — PENICILLIN G POTASSIUM 5000000 UNITS IJ SOLR
5.0000 10*6.[IU] | Freq: Once | INTRAVENOUS | Status: AC
  Administered 2016-07-27: 5 10*6.[IU] via INTRAVENOUS
  Filled 2016-07-27: qty 5

## 2016-07-27 MED ORDER — LACTATED RINGERS IV SOLN
INTRAVENOUS | Status: DC
  Administered 2016-07-27 (×2): via INTRAVENOUS

## 2016-07-27 MED ORDER — LACTATED RINGERS IV SOLN
500.0000 mL | Freq: Once | INTRAVENOUS | Status: AC
  Administered 2016-07-27: 1000 mL via INTRAVENOUS

## 2016-07-27 NOTE — Anesthesia Preprocedure Evaluation (Signed)
Anesthesia Evaluation  Patient identified by MRN, date of birth, ID band Patient awake    Reviewed: Allergy & Precautions, NPO status , Patient's Chart, lab work & pertinent test results  Airway Mallampati: II  TM Distance: >3 FB Neck ROM: Full    Dental no notable dental hx.    Pulmonary neg pulmonary ROS,    Pulmonary exam normal breath sounds clear to auscultation       Cardiovascular negative cardio ROS Normal cardiovascular exam Rhythm:Regular Rate:Normal     Neuro/Psych negative neurological ROS  negative psych ROS   GI/Hepatic negative GI ROS, Neg liver ROS,   Endo/Other  Morbid obesity  Renal/GU negative Renal ROS  negative genitourinary   Musculoskeletal negative musculoskeletal ROS (+)   Abdominal   Peds negative pediatric ROS (+)  Hematology negative hematology ROS (+)   Anesthesia Other Findings   Reproductive/Obstetrics (+) Pregnancy                             Anesthesia Physical Anesthesia Plan  ASA: III  Anesthesia Plan: Epidural   Post-op Pain Management:    Induction:   Airway Management Planned:   Additional Equipment:   Intra-op Plan:   Post-operative Plan:   Informed Consent: I have reviewed the patients History and Physical, chart, labs and discussed the procedure including the risks, benefits and alternatives for the proposed anesthesia with the patient or authorized representative who has indicated his/her understanding and acceptance.   Dental advisory given  Plan Discussed with: CRNA  Anesthesia Plan Comments:         Anesthesia Quick Evaluation

## 2016-07-27 NOTE — Anesthesia Pain Management Evaluation Note (Signed)
  CRNA Pain Management Visit Note  Patient: Monica Jimenez, 26 y.o., female  "Hello I am a member of the anesthesia team at [facility]. We have an anesthesia team available at all times to provide care throughout the hospital, including epidural management and anesthesia for C-section. I don't know your plan for the delivery whether it a natural birth, water birth, IV sedation, nitrous supplementation, doula or epidural, but we want to meet your pain goals."   1.Was your pain managed to your expectations on prior hospitalizations?   Yes   2.What is your expectation for pain management during this hospitalization?     Epidural  3.How can we help you reach that goal? epidural  Record the patient's initial score and the patient's pain goal.   Pain: 9  Pain Goal: 4 [facility] wants you to be able to say your pain was always managed very well.  Toua Stites 07/27/2016

## 2016-07-27 NOTE — MAU Note (Signed)
Patient presents with c/o vaginal spotting and ctx that started yesterday at 2130. Fetus active. GBS +.

## 2016-07-27 NOTE — H&P (Signed)
LABOR ADMISSION HISTORY AND PHYSICAL  Monica Jimenez is a 26 y.o. female G2P1001 with IUP at 4251w4d by US c/w LMP presenting for vaginal bleeding. Patient reports noticing vaginal bleeding this evening, so she presented to MAU. While in MAU she began to experience contractions that have been increasing in intensity and frequency. Denies LOF. Endorses fetal movement.   She plans on formula feeding. She request OCPs for birth control.  Dating: By [place] --->  Estimated Date of Delivery: 07/23/16  Sono:    @[redacted]w[redacted]d , CWD, normal anatomy, cephalic presentation, anterior lie, 937.9g   Prenatal History/Complications:  Past Medical History: Past Medical History:  Diagnosis Date  . Gallstones     Past Surgical History: History reviewed. No pertinent surgical history.  Obstetrical History: OB History    Gravida Para Term Preterm AB Living   2 1 1     1    SAB TAB Ectopic Multiple Live Births           1      Social History: Social History   Social History  . Marital status: Single    Spouse name: N/A  . Number of children: N/A  . Years of education: N/A   Social History Main Topics  . Smoking status: Never Smoker  . Smokeless tobacco: Never Used  . Alcohol use None     Comment: Ocassionaly  . Drug use: No  . Sexual activity: Not Asked   Other Topics Concern  . None   Social History Narrative  . None    Family History: Family History  Problem Relation Age of Onset  . Hypertension Mother   . Stroke Mother   . Stroke Father   . Diabetes Maternal Grandmother   . Alcohol abuse Neg Hx   . Arthritis Neg Hx   . Asthma Neg Hx   . Birth defects Neg Hx   . Cancer Neg Hx   . COPD Neg Hx   . Depression Neg Hx   . Drug abuse Neg Hx   . Early death Neg Hx   . Hearing loss Neg Hx   . Heart disease Neg Hx   . Hyperlipidemia Neg Hx   . Kidney disease Neg Hx   . Learning disabilities Neg Hx   . Mental illness Neg Hx   . Mental retardation Neg Hx   . Miscarriages /  Stillbirths Neg Hx   . Vision loss Neg Hx   . Varicose Veins Neg Hx     Allergies: No Known Allergies  Prescriptions Prior to Admission  Medication Sig Dispense Refill Last Dose  . Prenat-FeFum-FePo-FA-Omega 3 (TARON-C DHA) 53.5-38-1 MG CAPS Take 1 capsule by mouth daily.  12 07/26/2016 at Unknown time     Review of Systems   All systems reviewed and negative except as stated in HPI  BP 112/68 (BP Location: Left Arm)   Pulse 89   Temp 98.3 F (36.8 C) (Oral)   Resp 18  General appearance: alert, cooperative and morbidly obese Lungs: clear to auscultation bilaterally Heart: regular rate and rhythm Abdomen: soft, non-tender; bowel sounds normal Extremities: no sign of DVT, no LE edema Presentation: cephalic Fetal monitoringBaseline: 140 bpm, Variability: Fair (1-6 bpm), Accelerations: Reactive and Decelerations: Absent Uterine activity Irregular Dilation: 4.5 Effacement (%): 50 Station: -2 Exam by:: Lauren & Veronica RN  Prenatal labs: ABO, Rh: O/Positive/-- (10/11 0000) Antibody: Negative (10/11 0000) Rubella: !Error! Immune RPR: Nonreactive (10/11 0000)  HBsAg: Negative (10/11 0000)  HIV: Non-reactive (10/11 0000)  GBS: Positive (04/19 0000)  1 hr Glucola Passed (120) Anatomy US Normal  Prenatal Transfer Tool  Maternal Diabetes: No Genetic Screening: Declined Maternal Ultrasounds/Referrals: Normal Fetal Ultrasounds or other Referrals:  None Maternal Substance Abuse:  No Significant Maternal Medications:  None Significant Maternal Lab Results: Lab values include: Group B Strep positive  No results found for this or any previous visit (from the past 24 hour(s)).  Patient Active Problem List   Diagnosis Date Noted  . Uterine contractions during pregnancy 07/27/2016  . Palpitations 06/29/2016    Assessment: Monica Jimenez is a 26 y.o. G2P1001 at [redacted]w[redacted]d here for SOL.  #Labor:Expectant management #Pain: IV pain medication or epidural available upon  patient request #FWB: Cat 1 #ID:  GBS pos. Will give PCN.  #MOF: Formula #MOC:OCPs #Circ:  Undecided  Tarri Abernethy, MD, MPH PGY-2 [facility] Pager [phone]  CNM attestation:  I have seen and examined this patient; I agree with above documentation in the resident's note.   Monica Jimenez is a 26 y.o. G2P1001 here for latent labor  PE: BP 138/88   Pulse 85   Temp 98.3 F (36.8 C) (Oral)   Resp 18   Ht 5\' 1"  (1.549 m)   Wt 127.9 kg (282 lb)   SpO2 100%   BMI 53.28 kg/m  Gen: calm comfortable, NAD Resp: normal effort, no distress Abd: gravid  ROS, labs, PMH reviewed  Plan: Admit to [facility] Expectant management- may need augmentation later PCN for GBS ppx Anticipate SVD  Cam Hai CNM 07/27/2016, 7:31 AM

## 2016-07-27 NOTE — Progress Notes (Signed)
Labor Progress Note  Monica KaufmannCrystal Jimenez is a 26 y.o. G2P1001 at 10275w4d  admitted for active labor  S: Endorsing painful contractions. Initially good pain control with Fentanyl, but reports it has worn off now. Is considering requesting epidural.    O:  BP 138/88   Pulse 85   Temp 98.3 F (36.8 C) (Oral)   Resp 18   Ht 5\' 1"  (1.549 m)   Wt 282 lb (127.9 kg)   SpO2 100%   BMI 53.28 kg/m   No intake/output data recorded.  FHT:  FHR: 130 bpm, variability: moderate,  accelerations:  Present,  decelerations:  Absent UC:   regular SVE:   Dilation: 4.5 Effacement (%): 50 Station: -2 Exam by:: Lauren & Veronica RN   Labs: Lab Results  Component Value Date   WBC 11.0 (H) 07/27/2016   HGB 10.9 (L) 07/27/2016   HCT 33.0 (L) 07/27/2016   MCV 91.7 07/27/2016   PLT 252 07/27/2016    Assessment / Plan: 26 y.o. G2P1001 6475w4d in active labor Spontaneous labor, progressing normally  Labor: Progressing normally Fetal Wellbeing:  Category I Pain Control:  IV pain meds, though may request epidural soon Anticipated MOD:  NSVD  Expectant management   Tarri AbernethyAbigail J Aseel Uhde, MD, MPH PGY-2 [facility]

## 2016-07-27 NOTE — Progress Notes (Signed)
Labor Progress Note  Monica KaufmannCrystal Jimenez is a 26 y.o. G2P1001 at 3539w4d  admitted for active labor  S: Patient doing well. Pain is controlled with the epidural.  Pain, PIH symptoms (HA, scotomata, RUQ pain)  O:  BP (!) 94/46   Pulse 68   Temp 98.2 F (36.8 C) (Oral)   Resp 18   Ht 5\' 1"  (1.549 m)   Wt 282 lb (127.9 kg)   SpO2 100%   BMI 53.28 kg/m   Total I/O In: -  Out: 150 [Urine:150]  FHT:  FHR: 140 bpm, variability: moderate,  accelerations:  Present,  decelerations:  Absent UC:   regular, every 5 minutes SVE:   Dilation: 4.5 Effacement (%): 60 Station: -2 Exam by:: Howard PouchLauren Feng MD and Enis SlipperJane Bailey RN SROM/AROM: no rupture of membranes yet  Labs: Lab Results  Component Value Date   WBC 11.0 (H) 07/27/2016   HGB 10.9 (L) 07/27/2016   HCT 33.0 (L) 07/27/2016   MCV 91.7 07/27/2016   PLT 252 07/27/2016    Assessment / Plan: 26 y.o. G2P1001 3439w4d in early/active labor Spontaneous labor, progressing normally  Labor: Progressing normally Fetal Wellbeing:  Category I Pain Control:  Epidural Anticipated MOD:  NSVD  Expectant management   Howard PouchLauren Feng MD

## 2016-07-27 NOTE — Progress Notes (Signed)
Patient ID: Fransisca Kaufmannrystal Shular, female   DOB: 09/09/1990, 26 y.o.   MRN: [id number]  S: Patient seen & examined for progress of labor. Patient comfortable with epidural.    O:  Vitals:   07/27/16 1530 07/27/16 1533 07/27/16 1601 07/27/16 1630  BP: (!) 110/58  (!) 96/59 112/77  Pulse: 82  96 100  Resp: 18 20 18 20   Temp:    97.7 F (36.5 C)  TempSrc:    Oral  SpO2:      Weight:      Height:        Dilation: 6 Effacement (%): 90 Cervical Position: Anterior Station: -2 Presentation: Vertex Exam by:: Dr. Omer JackMumaw  While performing SVE, patient had ROM, clear fluid returned.   FHT: 135 bpm, mod var, +accels, no decels TOCO: q2-654min   A/P: Accidental AROM - clear fluid Continue pitocin Continue expectant management Anticipate SVD

## 2016-07-27 NOTE — Anesthesia Procedure Notes (Signed)
Epidural Patient location during procedure: OB  Staffing Anesthesiologist: Phillips GroutARIGNAN, Zayin Valadez Performed: anesthesiologist   Preanesthetic Checklist Completed: patient identified, site marked, surgical consent, pre-op evaluation, timeout performed, IV checked, risks and benefits discussed and monitors and equipment checked  Epidural Patient position: sitting Prep: DuraPrep Patient monitoring: heart rate, continuous pulse ox and blood pressure Approach: midline Location: L3-L4 Injection technique: LOR saline  Needle:  Needle type: Tuohy  Needle gauge: 17 G Needle length: 15 cm Needle insertion depth: 13 cm Catheter type: closed end flexible Catheter size: 20 Guage Catheter at skin depth: 16 cm Test dose: negative  Assessment Events: blood not aspirated, injection not painful, no injection resistance, negative IV test and no paresthesia  Additional Notes Difficult placement d/t super morbid obesity requiring long needle.

## 2016-07-28 MED ORDER — NORGESTREL-ETHINYL ESTRADIOL 0.3-30 MG-MCG PO TABS: 1.0000 | ORAL_TABLET | Freq: Every day | ORAL | 11 refills | Status: DC

## 2016-07-28 MED ORDER — IBUPROFEN 600 MG PO TABS: 600.0000 mg | ORAL_TABLET | Freq: Four times a day (QID) | ORAL | 0 refills | Status: DC

## 2016-07-28 NOTE — Anesthesia Postprocedure Evaluation (Signed)
Anesthesia Post Note  Patient: Monica Jimenez  Procedure(s) Performed: * No procedures listed *  Patient location during evaluation: Mother Baby Anesthesia Type: Epidural Level of consciousness: awake Pain management: pain level controlled Vital Signs Assessment: post-procedure vital signs reviewed and stable Respiratory status: spontaneous breathing Cardiovascular status: stable Postop Assessment: no headache, no backache, epidural receding and patient able to bend at knees        Last Vitals:  Vitals:   07/27/16 2230 07/28/16 0240  BP: (!) 94/56 (!) 112/56  Pulse: 74 76  Resp: 17 18  Temp: 37.3 C 37 C    Last Pain:  Vitals:   07/28/16 0607  TempSrc:   PainSc: 0-No pain   Pain Goal: Patients Stated Pain Goal: 5 (07/27/16 0720)               Edison PaceWILKERSON,Ajai [place]

## 2016-07-28 NOTE — Discharge Instructions (Signed)

## 2016-07-28 NOTE — Progress Notes (Signed)
Post Partum Day #1 Subjective: no complaints, up ad lib, voiding, tolerating PO and reports normal lochia  Objective: Blood pressure (!) 112/56, pulse 76, temperature 98.6 F (37 C), temperature source Oral, resp. rate 18, height 5\' 1"  (1.549 m), weight 127.9 kg (282 lb), SpO2 100 %, unknown if currently breastfeeding.  Physical Exam:  General: no complaints, up ad lib, voiding, tolerating PO and reports normal lochia Lochia: appropriate Uterine Fundus: firm and NT at U-1 DVT Evaluation: No evidence of DVT seen on physical exam.   Recent Labs  07/27/16 0508  HGB 10.9*  HCT 33.0*    Assessment/Plan: Plan for discharge tomorrow  Bottle OCPs   LOS: 1 day   Antwann Preziosi C Sa[place] 07/28/2016, 7:41 AM

## 2016-07-29 LAB — RPR: RPR Ser Ql: NONREACTIVE

## 2016-07-29 NOTE — Discharge Summary (Signed)
Obstetric Discharge Summary Reason for Admission: onset of labor- required augmentation with Pit/AROM Prenatal Procedures: none Intrapartum Procedures: spontaneous vaginal delivery Postpartum Procedures: none Complications-Operative and Postpartum: none Hemoglobin  Date Value Ref Range Status  07/27/2016 10.9 (L) 12.0 - 15.0 g/dL Final   HCT  Date Value Ref Range Status  07/27/2016 33.0 (L) 36.0 - 46.0 % Final    Physical Exam:  General: alert, cooperative and no distress Lochia: appropriate Uterine Fundus: firm  Discharge Diagnoses: Term Pregnancy-delivered  Discharge Information: Date: 07/29/2016 Activity: pelvic rest Diet: routine Medications: Ibuprofen Condition: stable Instructions: refer to practice specific booklet Discharge to: home Follow-up Information    [facility] Schedule an appointment as soon as possible for a visit in 1 month(s).   Specialty:  [facility] Health Services [facility]  [facility] Contact information: [address] [place] [phone]           Newborn Data: Live born female  Birth Weight: 7 lb 4.4 oz (3300 g) APGAR: 8, 9  Home with mother.  Monica SlimCaroline Jimenez SNM 07/29/2016, 7:34 AM   CNM attestation I have seen and examined this patient and agree with above documentation in the student nurse midwife's note.   Monica Jimenez is a 26 y.o. O1H0865G2P2002 s/p SVD.   Pain is well controlled.  Plan for birth control is oral contraceptives (estrogen/progesterone).  Method of Feeding: bottle  PE:  BP 120/79   Pulse 85   Temp 98 F (36.7 C) (Oral)   Resp 18   Ht 5\' 1"  (1.549 m)   Wt 127.9 kg (282 lb)   SpO2 97%   Breastfeeding? Unknown   BMI 53.28 kg/m  Fundus firm   Recent Labs  07/27/16 0508  HGB 10.9*  HCT 33.0*     Plan: discharge today - postpartum care discussed - f/u clinic in 4-6 weeks for postpartum visit   Monica Jimenez, KIMBERLY, CNM 9:09 AM 07/29/2016

## 2016-07-30 ENCOUNTER — Inpatient Hospital Stay (HOSPITAL_COMMUNITY): Admission: RE | Admit: 2016-07-30 | Payer: Medicaid Other | Source: Ambulatory Visit

## 2016-08-29 ENCOUNTER — Ambulatory Visit (HOSPITAL_COMMUNITY)
Admission: EM | Admit: 2016-08-29 | Discharge: 2016-08-29 | Disposition: A | Discharge status: Home / Self Care | Payer: Medicaid Other | Attending: Family Medicine | Admitting: Family Medicine

## 2016-08-29 ENCOUNTER — Encounter (HOSPITAL_COMMUNITY): Payer: Self-pay | Admitting: Emergency Medicine

## 2016-08-29 DIAGNOSIS — R112 Nausea with vomiting, unspecified: Secondary | ICD-10-CM

## 2016-08-29 DIAGNOSIS — K529 Noninfective gastroenteritis and colitis, unspecified: Secondary | ICD-10-CM

## 2016-08-29 DIAGNOSIS — R197 Diarrhea, unspecified: Secondary | ICD-10-CM

## 2016-08-29 LAB — POCT URINALYSIS DIP (DEVICE)
Bilirubin Urine: NEGATIVE
GLUCOSE, UA: NEGATIVE mg/dL
Hgb urine dipstick: NEGATIVE
KETONES UR: NEGATIVE mg/dL
NITRITE: NEGATIVE
PH: 5 (ref 5.0–8.0)
PROTEIN: NEGATIVE mg/dL
Specific Gravity, Urine: 1.01 (ref 1.005–1.030)
Urobilinogen, UA: 0.2 mg/dL (ref 0.0–1.0)

## 2016-08-29 LAB — POCT I-STAT, CHEM 8
BUN: 10 mg/dL (ref 6–20)
CALCIUM ION: 1.18 mmol/L (ref 1.15–1.40)
Chloride: 104 mmol/L (ref 101–111)
Creatinine, Ser: 0.8 mg/dL (ref 0.44–1.00)
Glucose, Bld: 86 mg/dL (ref 65–99)
HEMATOCRIT: 40 % (ref 36.0–46.0)
HEMOGLOBIN: 13.6 g/dL (ref 12.0–15.0)
Potassium: 3.8 mmol/L (ref 3.5–5.1)
SODIUM: 141 mmol/L (ref 135–145)
TCO2: 25 mmol/L (ref 0–100)

## 2016-08-29 MED ORDER — ONDANSETRON 8 MG PO TBDP: 8.0000 mg | ORAL_TABLET | Freq: Three times a day (TID) | ORAL | 0 refills | Status: DC | PRN

## 2016-08-29 MED ORDER — HYOSCYAMINE SULFATE SL 0.125 MG SL SUBL: 1.0000 | SUBLINGUAL_TABLET | Freq: Three times a day (TID) | SUBLINGUAL | 1 refills | Status: DC | PRN

## 2016-08-29 NOTE — ED Provider Notes (Signed)
[facility]    CSN: [id number] Arrival date & time: 08/29/16  1422     History   Chief Complaint Chief Complaint  Patient presents with  . Abdominal Pain    HPI Monica KaufmannCrystal Jimenez is a 26 y.o. female.   [facility] a [facility] of recurrent sharp abdominal pains. The patient reported N/V/D  Patient is one month postpartum and she's had a period since her delivery. She's also been sexually active since her baby was born. She reports that the abdominal pain is periumbilical and comes in sharp crampy episodes. She is more comfortable lying supine.      Past Medical History:  Diagnosis Date  . Gallstones   . Obesity, morbid, BMI 50 or higher (HCC)     Patient Active Problem List   Diagnosis Date Noted  . Uterine contractions during pregnancy 07/27/2016  . Palpitations 06/29/2016    Past Surgical History:  Procedure Laterality Date  . NO PAST SURGERIES      OB History    Gravida Para Term Preterm AB Living   2 2 2  0 0 2   SAB TAB Ectopic Multiple Live Births   0 0 0 0 2       Home Medications    Prior to Admission medications   Medication Sig Start Date End Date Taking? Authorizing Provider  Hyoscyamine Sulfate SL (LEVSIN/SL) 0.125 MG SUBL Place 1 tablet under the tongue every 8 (eight) hours as needed. 08/29/16   Elvina SidleLauenstein, Candies Palm, MD  ondansetron (ZOFRAN-ODT) 8 MG disintegrating tablet Take 1 tablet (8 mg total) by mouth every 8 (eight) hours as needed for nausea. 08/29/16   Elvina SidleLauenstein, Darsh Vandevoort, MD    Family History Family History  Problem Relation Age of Onset  . Hypertension Mother   . Stroke Mother   . Stroke Father   . Diabetes Maternal Grandmother   . Alcohol abuse Neg Hx   . Arthritis Neg Hx   . Asthma Neg Hx   . Birth defects Neg Hx   . Cancer Neg Hx   . COPD Neg Hx   . Depression Neg Hx   . Drug abuse Neg Hx   . Early death Neg Hx   . Hearing loss Neg Hx   . Heart disease Neg Hx   . Hyperlipidemia Neg Hx     . Kidney disease Neg Hx   . Learning disabilities Neg Hx   . Mental illness Neg Hx   . Mental retardation Neg Hx   . Miscarriages / Stillbirths Neg Hx   . Vision loss Neg Hx   . Varicose Veins Neg Hx     Social History Social History  Substance Use Topics  . Smoking status: Never Smoker  . Smokeless tobacco: Never Used  . Alcohol use No     Comment: Ocassionaly     Allergies   Patient has no known allergies.   Review of Systems Review of Systems  Gastrointestinal: Positive for abdominal pain, diarrhea, nausea and vomiting.  All other systems reviewed and are negative.    Physical Exam Triage Vital Signs ED Triage Vitals  Enc Vitals Group     BP 08/29/16 1441 130/74     Pulse Rate 08/29/16 1441 82     Resp 08/29/16 1441 20     Temp 08/29/16 1441 98.4 F (36.9 C)     Temp Source 08/29/16 1441 Oral     SpO2 08/29/16 1441 100 %  Weight --      Height --      Head Circumference --      Peak Flow --      Pain Score 08/29/16 1440 10     Pain Loc --      Pain Edu? --      Excl. in GC? --    No data found.   Updated Vital Signs BP 130/74 (BP Location: Right Arm)   Pulse 82   Temp 98.4 F (36.9 C) (Oral)   Resp 20   SpO2 100%   Breastfeeding? No    Physical Exam  Constitutional: She is oriented to person, place, and time. She appears well-developed and well-nourished.  HENT:  Right Ear: External ear normal.  Left Ear: External ear normal.  Mouth/Throat: Oropharynx is clear and moist.  Eyes: Conjunctivae are normal. Pupils are equal, round, and reactive to light.  Neck: Normal range of motion. Neck supple.  Cardiovascular: Normal rate, regular rhythm and normal heart sounds.   Pulmonary/Chest: Effort normal and breath sounds normal.  Abdominal: Soft. Bowel sounds are normal. She exhibits no distension and no mass. There is tenderness. There is no rebound. No hernia.  Tender midabdomen  Musculoskeletal: Normal range of motion.  Neurological: She is  alert and oriented to person, place, and time.  Skin: Skin is warm and dry.  Nursing note and vitals reviewed.    UC Treatments / Results  Labs (all labs ordered are listed, but only abnormal results are displayed) Labs Reviewed  POCT URINALYSIS DIP (DEVICE) - Abnormal; Notable for the following:       Result Value   Leukocytes, UA TRACE (*)    All other components within normal limits    EKG  EKG Interpretation None       Radiology No results found.  Procedures Procedures (including critical care time)  Medications Ordered in UC Medications - No data to display   Initial Impression / Assessment and Plan / UC Course  I have reviewed the triage vital signs and the nursing notes.  Pertinent labs & imaging results that were available during my care of the patient were reviewed by me and considered in my medical decision making (see chart for details).     Final Clinical Impressions(s) / UC Diagnoses   Final diagnoses:  Noninfectious gastroenteritis, unspecified type  Nausea vomiting and diarrhea    New Prescriptions New Prescriptions   HYOSCYAMINE SULFATE SL (LEVSIN/SL) 0.125 MG SUBL    Place 1 tablet under the tongue every 8 (eight) hours as needed.   ONDANSETRON (ZOFRAN-ODT) 8 MG DISINTEGRATING TABLET    Take 1 tablet (8 mg total) by mouth every 8 (eight) hours as needed for nausea.     Elvina Sidle, MD 08/29/16 1551

## 2016-08-29 NOTE — ED Triage Notes (Signed)
[facility] a [facility] sharp [facility] pains. The patient reported N/V/D

## 2016-08-29 NOTE — Discharge Instructions (Signed)
Your laboratory test results are normal. You appear to have a viral infection and therefore we are giving him medicine to ease the discomfort. If your symptoms continue for more than 6 hours, please return to the emergency department for further evaluation.

## 2016-10-04 ENCOUNTER — Encounter: Payer: Self-pay | Admitting: *Deleted

## 2016-10-04 ENCOUNTER — Ambulatory Visit: Payer: Medicaid Other | Admitting: Physician Assistant

## 2016-10-04 NOTE — Progress Notes (Deleted)
Cardiology Office Note   Date:  10/04/2016   ID:  Monica Kaufmannrystal Jimenez, DOB 07-08-1990, MRN [id number]  PCP:  Patient, No Pcp Per  Cardiologist:  Dr. Allyson SabalBerry, 06/29/2016  Monica Jimenez, Monica Loserhonda, PA-C   No chief complaint on file.   History of Present Illness: Monica KaufmannCrystal Jimenez is a 26 y.o. female with a history of irregular heartbeat (sinus rhythm/sinus tach with PACs), morbid obesity  06/29/2016 office visit, patient evaluated by Dr. Allyson SabalBerry. She was [redacted] weeks pregnant he recommended three-month follow-up, no meds  Monica Jimenez presents for ***   Past Medical History:  Diagnosis Date  . Gallstones   . Obesity, morbid, BMI 50 or higher (HCC)     Past Surgical History:  Procedure Laterality Date  . NO PAST SURGERIES      Current Outpatient Prescriptions  Medication Sig Dispense Refill  . Hyoscyamine Sulfate SL (LEVSIN/SL) 0.125 MG SUBL Place 1 tablet under the tongue every 8 (eight) hours as needed. 12 each 1  . ondansetron (ZOFRAN-ODT) 8 MG disintegrating tablet Take 1 tablet (8 mg total) by mouth every 8 (eight) hours as needed for nausea. 12 tablet 0   No current facility-administered medications for this visit.     Allergies:   Patient has no known allergies.    Social History:  The patient  reports that she has never smoked. She has never used smokeless tobacco. She reports that she does not drink alcohol or use drugs.   Family History:  The patient's family history includes Diabetes in her maternal grandmother; Hypertension in her mother; Stroke in her father and mother.    ROS:  Please see the history of present illness. All other systems are reviewed and negative.    PHYSICAL EXAM: VS:  There were no vitals taken for this visit. , BMI There is no height or weight on file to calculate BMI. GEN: Well nourished, well developed, female in no acute distress  HEENT: normal for age  Neck: no JVD, no carotid bruit, no masses Cardiac: RRR; no murmur, no rubs, or  gallops Respiratory:  clear to auscultation bilaterally, normal work of breathing GI: soft, nontender, nondistended, + BS MS: no deformity or atrophy; no edema; distal pulses are 2+ in all 4 extremities   Skin: warm and dry, no rash Neuro:  Strength and sensation are intact Psych: euthymic mood, full affect   EKG:  EKG {ACTION; IS/IS [id number]}OT:[id number]} ordered today. The ekg ordered today demonstrates ***   Recent Labs: 07/27/2016: Platelets 252 08/29/2016: BUN 10; Creatinine, Ser 0.80; Hemoglobin 13.6; Potassium 3.8; Sodium 141    Lipid Panel No results found for: CHOL, TRIG, HDL, CHOLHDL, VLDL, LDLCALC, LDLDIRECT   Wt Readings from Last 3 Encounters:  07/27/16 282 lb (127.9 kg)  06/29/16 280 lb 3.2 oz (127.1 kg)  06/28/16 281 lb (127.5 kg)     Other studies Reviewed: Additional studies/ records that were reviewed today include: ***.  ASSESSMENT AND PLAN:  1.  ***   Current medicines are reviewed at length with the patient today.  The patient {ACTIONS; HAS/DOES NOT HAVE:19233} concerns regarding medicines.  The following changes have been made:  {PLAN; NO CHANGE:13088:s}  Labs/ tests ordered today include: *** No orders of the defined types were placed in this encounter.    Disposition:   FU with ***  Signed, Theodore DemarkBarrett, Darwin Guastella, PA-C  10/04/2016 1:09 PM    [facility] Phone: [phone]; Fax: [phone]  This note was written with the assistance  of speech recognition software. Please excuse any transcriptional errors.

## 2017-09-06 LAB — OB RESULTS CONSOLE ANTIBODY SCREEN: ANTIBODY SCREEN: NEGATIVE

## 2017-09-06 LAB — OB RESULTS CONSOLE HIV ANTIBODY (ROUTINE TESTING): HIV: NONREACTIVE

## 2017-09-06 LAB — OB RESULTS CONSOLE GC/CHLAMYDIA
Chlamydia: NEGATIVE
Gonorrhea: NEGATIVE

## 2017-09-06 LAB — OB RESULTS CONSOLE RPR: RPR: NONREACTIVE

## 2017-09-06 LAB — OB RESULTS CONSOLE RUBELLA ANTIBODY, IGM: Rubella: IMMUNE

## 2017-09-06 LAB — OB RESULTS CONSOLE ABO/RH: RH Type: POSITIVE

## 2017-09-06 LAB — OB RESULTS CONSOLE VARICELLA ZOSTER ANTIBODY, IGG: Varicella: IMMUNE

## 2017-09-06 LAB — OB RESULTS CONSOLE HEPATITIS B SURFACE ANTIGEN: Hepatitis B Surface Ag: NEGATIVE

## 2017-09-07 ENCOUNTER — Other Ambulatory Visit (HOSPITAL_COMMUNITY): Payer: Self-pay | Admitting: Family

## 2017-09-07 DIAGNOSIS — Z3A13 13 weeks gestation of pregnancy: Secondary | ICD-10-CM

## 2017-09-07 DIAGNOSIS — Z369 Encounter for antenatal screening, unspecified: Secondary | ICD-10-CM

## 2017-09-20 ENCOUNTER — Encounter (HOSPITAL_COMMUNITY): Payer: Self-pay

## 2017-09-25 ENCOUNTER — Encounter (HOSPITAL_COMMUNITY): Payer: Self-pay | Admitting: *Deleted

## 2017-09-27 ENCOUNTER — Ambulatory Visit (HOSPITAL_COMMUNITY)
Admission: RE | Admit: 2017-09-27 | Discharge: 2017-09-27 | Disposition: A | Discharge status: Home / Self Care | Payer: Medicaid Other | Source: Ambulatory Visit | Attending: Family | Admitting: Family

## 2017-09-27 ENCOUNTER — Other Ambulatory Visit (HOSPITAL_COMMUNITY): Payer: Self-pay | Admitting: Family

## 2017-09-27 ENCOUNTER — Encounter (HOSPITAL_COMMUNITY): Payer: Self-pay

## 2017-09-27 ENCOUNTER — Ambulatory Visit (HOSPITAL_COMMUNITY): Admission: RE | Admit: 2017-09-27 | Payer: Medicaid Other | Source: Ambulatory Visit

## 2017-09-27 ENCOUNTER — Other Ambulatory Visit (HOSPITAL_COMMUNITY): Payer: Self-pay | Admitting: *Deleted

## 2017-09-27 DIAGNOSIS — O99211 Obesity complicating pregnancy, first trimester: Secondary | ICD-10-CM | POA: Insufficient documentation

## 2017-09-27 DIAGNOSIS — Z369 Encounter for antenatal screening, unspecified: Secondary | ICD-10-CM

## 2017-09-27 DIAGNOSIS — Z3682 Encounter for antenatal screening for nuchal translucency: Secondary | ICD-10-CM | POA: Diagnosis present

## 2017-09-27 DIAGNOSIS — Z3A11 11 weeks gestation of pregnancy: Secondary | ICD-10-CM

## 2017-09-27 DIAGNOSIS — E669 Obesity, unspecified: Secondary | ICD-10-CM | POA: Insufficient documentation

## 2017-09-27 DIAGNOSIS — Z3A13 13 weeks gestation of pregnancy: Secondary | ICD-10-CM

## 2017-09-27 HISTORY — DX: Unspecified hemorrhoids: K64.9

## 2017-09-27 HISTORY — DX: Trichomoniasis, unspecified: A59.9

## 2017-09-27 HISTORY — DX: Anemia, unspecified: D64.9

## 2017-09-27 HISTORY — DX: Unspecified abnormal cytological findings in specimens from vagina: R87.629

## 2017-09-27 HISTORY — DX: Papillomavirus as the cause of diseases classified elsewhere: B97.7

## 2017-10-11 ENCOUNTER — Other Ambulatory Visit (HOSPITAL_COMMUNITY): Payer: Medicaid Other

## 2017-10-11 ENCOUNTER — Encounter (HOSPITAL_COMMUNITY): Payer: Self-pay

## 2017-10-11 ENCOUNTER — Ambulatory Visit (HOSPITAL_COMMUNITY)
Admission: RE | Admit: 2017-10-11 | Discharge: 2017-10-11 | Disposition: A | Discharge status: Home / Self Care | Payer: Medicaid Other | Source: Ambulatory Visit | Attending: Family | Admitting: Family

## 2017-10-29 ENCOUNTER — Emergency Department (HOSPITAL_COMMUNITY)
Admission: EM | Admit: 2017-10-29 | Discharge: 2017-10-29 | Disposition: A | Discharge status: Home / Self Care | Payer: Medicaid Other | Attending: Emergency Medicine | Admitting: Emergency Medicine

## 2017-10-29 ENCOUNTER — Other Ambulatory Visit: Payer: Self-pay

## 2017-10-29 ENCOUNTER — Encounter (HOSPITAL_COMMUNITY): Payer: Self-pay | Admitting: *Deleted

## 2017-10-29 DIAGNOSIS — O9A213 Injury, poisoning and certain other consequences of external causes complicating pregnancy, third trimester: Secondary | ICD-10-CM | POA: Insufficient documentation

## 2017-10-29 DIAGNOSIS — Y929 Unspecified place or not applicable: Secondary | ICD-10-CM | POA: Insufficient documentation

## 2017-10-29 DIAGNOSIS — S76212A Strain of adductor muscle, fascia and tendon of left thigh, initial encounter: Secondary | ICD-10-CM

## 2017-10-29 DIAGNOSIS — Y9389 Activity, other specified: Secondary | ICD-10-CM | POA: Insufficient documentation

## 2017-10-29 DIAGNOSIS — X509XXA Other and unspecified overexertion or strenuous movements or postures, initial encounter: Secondary | ICD-10-CM | POA: Diagnosis not present

## 2017-10-29 DIAGNOSIS — Z3A16 16 weeks gestation of pregnancy: Secondary | ICD-10-CM | POA: Diagnosis not present

## 2017-10-29 DIAGNOSIS — S76219A Strain of adductor muscle, fascia and tendon of unspecified thigh, initial encounter: Secondary | ICD-10-CM | POA: Insufficient documentation

## 2017-10-29 DIAGNOSIS — Z87891 Personal history of nicotine dependence: Secondary | ICD-10-CM | POA: Diagnosis not present

## 2017-10-29 DIAGNOSIS — Y998 Other external cause status: Secondary | ICD-10-CM | POA: Diagnosis not present

## 2017-10-29 LAB — WET PREP, GENITAL
Clue Cells Wet Prep HPF POC: NONE SEEN
SPERM: NONE SEEN
Trich, Wet Prep: NONE SEEN
Yeast Wet Prep HPF POC: NONE SEEN

## 2017-10-29 LAB — URINALYSIS, ROUTINE W REFLEX MICROSCOPIC
BILIRUBIN URINE: NEGATIVE
GLUCOSE, UA: NEGATIVE mg/dL
HGB URINE DIPSTICK: NEGATIVE
Ketones, ur: NEGATIVE mg/dL
Leukocytes, UA: NEGATIVE
Nitrite: NEGATIVE
PH: 5 (ref 5.0–8.0)
Protein, ur: NEGATIVE mg/dL
SPECIFIC GRAVITY, URINE: 1.023 (ref 1.005–1.030)

## 2017-10-29 LAB — HCG, QUANTITATIVE, PREGNANCY: hCG, Beta Chain, Quant, S: 63870 m[IU]/mL — ABNORMAL HIGH (ref <5)

## 2017-10-29 MED ORDER — ACETAMINOPHEN 325 MG PO TABS
650.0000 mg | ORAL_TABLET | Freq: Once | ORAL | Status: AC
  Administered 2017-10-29: 650 mg via ORAL
  Filled 2017-10-29: qty 2

## 2017-10-29 NOTE — ED Provider Notes (Signed)
[facility] Provider Note   CSN: [id number] Arrival date & time: 10/29/17  1426   History   Chief Complaint Chief Complaint  Patient presents with  . Vaginal Pain    HPI Monica Jimenez is a 27 y.o. female.  HPI   [age] YOF presents today with complaints of left groin and vaginal pain. Patient notes symptoms have been going on for 2 weeks, initially were improving but then again worsened. She felt like she pulled a muscle in her groin going up the stairs. She notes pain has been constant worse with moving the left lower extremity. She denies any vaginal discharge or bleeding, denies any abdominal pelvic pain. Patient noted she is [redacted] weeks pregnant followed at the health Department. She has confirmed IUP by ultrasound  Past Medical History:  Diagnosis Date  . Anemia   . Gallstones   . Hemorrhoids   . HPV in female 2014  . Obesity, morbid, BMI 50 or higher (HCC)   . Trichomoniasis   . Vaginal Pap smear, abnormal     Patient Active Problem List   Diagnosis Date Noted  . Uterine contractions during pregnancy 07/27/2016  . Palpitations 06/29/2016    Past Surgical History:  Procedure Laterality Date  . EXCISION VAGINAL CYST       OB History    Gravida  3   Para  2   Term  2   Preterm  0   AB  0   Living  2     SAB  0   TAB  0   Ectopic  0   Multiple  0   Live Births  2            Home Medications    Prior to Admission medications   Medication Sig Start Date End Date Taking? Authorizing Provider  Hyoscyamine Sulfate SL (LEVSIN/SL) 0.125 MG SUBL Place 1 tablet under the tongue every 8 (eight) hours as needed. Patient not taking: Reported on 09/27/2017 08/29/16   Elvina SidleLauenstein, Kurt, MD  ondansetron (ZOFRAN-ODT) 8 MG disintegrating tablet Take 1 tablet (8 mg total) by mouth every 8 (eight) hours as needed for nausea. Patient not taking: Reported on 09/27/2017 08/29/16   Elvina SidleLauenstein, Kurt, MD    Family  History Family History  Problem Relation Age of Onset  . Hypertension Mother   . Stroke Mother   . Stroke Father   . Diabetes Maternal Grandmother   . Alcohol abuse Neg Hx   . Arthritis Neg Hx   . Asthma Neg Hx   . Birth defects Neg Hx   . Cancer Neg Hx   . COPD Neg Hx   . Depression Neg Hx   . Drug abuse Neg Hx   . Early death Neg Hx   . Hearing loss Neg Hx   . Heart disease Neg Hx   . Hyperlipidemia Neg Hx   . Kidney disease Neg Hx   . Learning disabilities Neg Hx   . Mental illness Neg Hx   . Mental retardation Neg Hx   . Miscarriages / Stillbirths Neg Hx   . Vision loss Neg Hx   . Varicose Veins Neg Hx     Social History Social History   Tobacco Use  . Smoking status: Former Games developermoker  . Smokeless tobacco: Never Used  . Tobacco comment: marijuana, last 2 yrs ago  Substance Use Topics  . Alcohol use: No    Comment: Ocassionaly  . Drug use: Not  Currently    Types: Marijuana     Allergies   Patient has no known allergies.   Review of Systems Review of Systems  All other systems reviewed and are negative.   Physical Exam Updated Vital Signs BP (!) 145/89 (BP Location: Right Wrist)   Pulse 95   Temp 98.3 F (36.8 C) (Oral)   Resp 14   LMP 06/30/2017   SpO2 100%   Physical Exam  Constitutional: She is oriented to person, place, and time. She appears well-developed and well-nourished.  HENT:  Head: Normocephalic and atraumatic.  Eyes: Pupils are equal, round, and reactive to light. Conjunctivae are normal. Right eye exhibits no discharge. Left eye exhibits no discharge. No scleral icterus.  Neck: Normal range of motion. No JVD present. No tracheal deviation present.  Pulmonary/Chest: Effort normal. No stridor.  Genitourinary:  Genitourinary Comments: No significant vaginal discharge noted, no cervical motion tenderness, minor tenderness to palpation of the left vaginal wall  Musculoskeletal:  There is tenderness with palpation of the left groin, worse  with abduction of the hip  Neurological: She is alert and oriented to person, place, and time. Coordination normal.  Psychiatric: She has a normal mood and affect. Her behavior is normal. Judgment and thought content normal.  Nursing note and vitals reviewed.    ED Treatments / Results  Labs (all labs ordered are listed, but only abnormal results are displayed) Labs Reviewed  WET PREP, GENITAL - Abnormal; Notable for the following components:      Result Value   WBC, Wet Prep HPF POC FEW (*)    All other components within normal limits  HCG, QUANTITATIVE, PREGNANCY - Abnormal; Notable for the following components:   hCG, Beta Chain, Quant, S [id number] (*)    All other components within normal limits  URINALYSIS, ROUTINE W REFLEX MICROSCOPIC  GC/CHLAMYDIA PROBE AMP ([place]) NOT AT [facility]    EKG None  Radiology No results found.  Procedures Procedures (including critical care time)  Medications Ordered in ED Medications  acetaminophen (TYLENOL) tablet 650 mg (650 mg Oral Given 10/29/17 1949)     Initial Impression / Assessment and Plan / ED Course  I have reviewed the triage vital signs and the nursing notes.  Pertinent labs & imaging results that were available during my care of the patient were reviewed by me and considered in my medical decision making (see chart for details).     Labs: wet prep, GC, hCG, urinalysis  Imaging:  Consults:  Therapeutics:  Discharge Meds:   Assessment/Plan: 27 year old female presents today with complaints of vaginal and groin pain. Have high suspicion for groin strain. No signs of abnormalities noted in the vaginal vault, no significant discharge. Patient will follow up as an outpatient with OB/GYN, she is given strict return pressures, she verbalized understanding and agreement to today's plan.   Final Clinical Impressions(s) / ED Diagnoses   Final diagnoses:  Groin strain, left, initial encounter    ED Discharge Orders     None       Rosalio LoudHedges, Madolin Twaddle, PA-C 10/29/17 2124    Virgina Norfolkuratolo, Adam, DO 10/29/17 2334

## 2017-10-29 NOTE — ED Triage Notes (Addendum)
Pt in c/o pain to the left side of her vagina when she is walking or moving, pain worse today, denies vaginal discharge, pt is [redacted] weeks pregnant

## 2017-10-29 NOTE — Discharge Instructions (Addendum)
Please read attached information. If you experience any new or worsening signs or symptoms please return to the emergency room for evaluation. Please follow-up with your primary care provider or specialist as discussed. Please use tylenol as needed for pain.  °

## 2017-10-29 NOTE — ED Provider Notes (Signed)
Patient placed in Quick Look pathway, seen and evaluated   Chief Complaint: Vaginal pain/swelling  HPI:   27 year old female presents with ~2 weeks of vaginal swelling/pressure on the left side. She states that this is her third pregnancy and she's never had this before. She thought it was possibly a muscle from going up stairs. She rested and it got better but has been constant today. She states it is better with lying down, worse with walking or sitting. She [facility] to the health dept and she thinks she's about 16-[redacted] weeks pregnant but is unsure because she hasn't had regular care. No vaginal bleeding, discharge.   ROS: +vaginal swelling  Physical Exam:   Gen: No distress, obese  Neuro: Awake and Alert  Skin: Warm    Focused Exam: Abdomen: Obese, soft, non-tender.   Initiation of care has begun. The patient has been counseled on the process, plan, and necessity for staying for the completion/evaluation, and the remainder of the medical screening examination    Bethel BornGekas, Kelly Marie, PA-C 10/29/17 1503    Gwyneth SproutPlunkett, Whitney, MD 10/29/17 2031

## 2017-10-30 LAB — GC/CHLAMYDIA PROBE AMP (~~LOC~~) NOT AT ARMC
CHLAMYDIA, DNA PROBE: NEGATIVE
NEISSERIA GONORRHEA: NEGATIVE

## 2018-02-22 ENCOUNTER — Ambulatory Visit (HOSPITAL_COMMUNITY)
Admission: EM | Admit: 2018-02-22 | Discharge: 2018-02-22 | Disposition: A | Discharge status: Home / Self Care | Payer: Medicaid Other | Attending: Family Medicine | Admitting: Family Medicine

## 2018-02-22 ENCOUNTER — Other Ambulatory Visit: Payer: Self-pay

## 2018-02-22 ENCOUNTER — Encounter (HOSPITAL_COMMUNITY): Payer: Self-pay | Admitting: Family Medicine

## 2018-02-22 DIAGNOSIS — J069 Acute upper respiratory infection, unspecified: Secondary | ICD-10-CM | POA: Insufficient documentation

## 2018-02-22 DIAGNOSIS — B9789 Other viral agents as the cause of diseases classified elsewhere: Secondary | ICD-10-CM | POA: Diagnosis not present

## 2018-02-22 MED ORDER — BENZONATATE 100 MG PO CAPS: 100.0000 mg | ORAL_CAPSULE | Freq: Two times a day (BID) | ORAL | 0 refills | Status: DC | PRN

## 2018-02-22 NOTE — ED Provider Notes (Signed)
[facility]    CSN: [id number] Arrival date & time: 02/22/18  1021     History   Chief Complaint Chief Complaint  Patient presents with  . Cough    HPI Monica Jimenez is a 27 y.o. female.   27 year old established: Urgent care patient comes in with cough and vomiting.  She last vomited last night.  She is [redacted] weeks pregnant now.  She is not having no vaginal bleeding or cramping.  Patient's had no diarrhea.  Says no ear pain or sore throat.  She just has some muscle soreness from coughing so much over the last 2 days.     Past Medical History:  Diagnosis Date  . Anemia   . Gallstones   . Hemorrhoids   . HPV in female 2014  . Obesity, morbid, BMI 50 or higher (HCC)   . Trichomoniasis   . Vaginal Pap smear, abnormal     Patient Active Problem List   Diagnosis Date Noted  . Uterine contractions during pregnancy 07/27/2016  . Palpitations 06/29/2016    Past Surgical History:  Procedure Laterality Date  . EXCISION VAGINAL CYST      OB History    Gravida  3   Para  2   Term  2   Preterm  0   AB  0   Living  2     SAB  0   TAB  0   Ectopic  0   Multiple  0   Live Births  2            Home Medications    Prior to Admission medications   Medication Sig Start Date End Date Taking? Authorizing Provider  benzonatate (TESSALON) 100 MG capsule Take 1-2 capsules (100-200 mg total) by mouth 2 (two) times daily as needed for cough. 02/22/18   Elvina SidleLauenstein, [name], MD    Family History Family History  Problem Relation Age of Onset  . Hypertension Mother   . Stroke Mother   . Stroke Father   . Diabetes Maternal Grandmother   . Alcohol abuse Neg Hx   . Arthritis Neg Hx   . Asthma Neg Hx   . Birth defects Neg Hx   . Cancer Neg Hx   . COPD Neg Hx   . Depression Neg Hx   . Drug abuse Neg Hx   . Early death Neg Hx   . Hearing loss Neg Hx   . Heart disease Neg Hx   . Hyperlipidemia Neg Hx   . Kidney disease Neg Hx   . Learning  disabilities Neg Hx   . Mental illness Neg Hx   . Mental retardation Neg Hx   . Miscarriages / Stillbirths Neg Hx   . Vision loss Neg Hx   . Varicose Veins Neg Hx     Social History Social History   Tobacco Use  . Smoking status: Former Games developermoker  . Smokeless tobacco: Never Used  . Tobacco comment: marijuana, last 2 yrs ago  Substance Use Topics  . Alcohol use: No    Comment: Ocassionaly  . Drug use: Not Currently    Types: Marijuana     Allergies   Patient has no known allergies.   Review of Systems Review of Systems   Physical Exam Triage Vital Signs ED Triage Vitals  Enc Vitals Group     BP      Pulse      Resp      Temp  Temp src      SpO2      Weight      Height      Head Circumference      Peak Flow      Pain Score      Pain Loc      Pain Edu?      Excl. in GC?    No data found.  Updated Vital Signs BP 119/77 (BP Location: Right Arm)   Pulse (!) 115   Temp 98.4 F (36.9 C) (Oral)   Resp 20   LMP 06/30/2017   SpO2 95%    Physical Exam Nursing note reviewed.  Constitutional:      General: She is not in acute distress.    Appearance: Normal appearance. She is obese.  HENT:     Head: Normocephalic.     Right Ear: Tympanic membrane, ear canal and external ear normal.     Left Ear: Tympanic membrane, ear canal and external ear normal.     Nose: Nose normal.     Mouth/Throat:     Mouth: Mucous membranes are moist.     Comments: Geographic tongue Eyes:     Conjunctiva/sclera: Conjunctivae normal.  Cardiovascular:     Rate and Rhythm: Normal rate and regular rhythm.     Heart sounds: Normal heart sounds.  Pulmonary:     Effort: Pulmonary effort is normal.     Breath sounds: Normal breath sounds.  Abdominal:     General: Bowel sounds are normal.     Tenderness: There is no abdominal tenderness.     Comments: Gravid abdomen  Musculoskeletal: Normal range of motion.  Skin:    General: Skin is warm and dry.  Neurological:      General: No focal deficit present.     Mental Status: She is alert.  Psychiatric:        Mood and Affect: Mood normal.      UC Treatments / Results  Labs (all labs ordered are listed, but only abnormal results are displayed) Labs Reviewed - No data to display  EKG None  Radiology No results found.  Procedures Procedures (including critical care time)  Medications Ordered in UC Medications - No data to display  Initial Impression / Assessment and Plan / UC Course  I have reviewed the triage vital signs and the nursing notes.  Pertinent labs & imaging results that were available during my care of the patient were reviewed by me and considered in my medical decision making (see chart for details).    Final Clinical Impressions(s) / UC Diagnoses   Final diagnoses:  Viral URI with cough     Discharge Instructions     This appears to be a viral upper respiratory infection which should run its course over the next several days.  You can take Tylenol for fever.  I am prescribing some Tessalon cough medicine that is taken twice a day as needed for cough.  This would be safe while you are pregnant.    ED Prescriptions    Medication Sig Dispense Auth. Provider   benzonatate (TESSALON) 100 MG capsule Take 1-2 capsules (100-200 mg total) by mouth 2 (two) times daily as needed for cough. 40 capsule Elvina Sidle, MD     Controlled Substance Prescriptions [place] Controlled Substance Registry consulted? Not Applicable   Elvina Sidle, MD 02/22/18 1106

## 2018-02-22 NOTE — ED Triage Notes (Signed)
C/o cough and periods of vomiting with sore throat.

## 2018-02-22 NOTE — Discharge Instructions (Addendum)
This appears to be a viral upper respiratory infection which should run its course over the next several days.  You can take Tylenol for fever.  I am prescribing some Tessalon cough medicine that is taken twice a day as needed for cough.  This would be safe while you are pregnant.

## 2018-03-21 LAB — OB RESULTS CONSOLE GBS: GBS: POSITIVE

## 2018-04-16 ENCOUNTER — Encounter (HOSPITAL_COMMUNITY): Payer: Self-pay | Admitting: *Deleted

## 2018-04-16 ENCOUNTER — Other Ambulatory Visit: Payer: Self-pay

## 2018-04-16 ENCOUNTER — Inpatient Hospital Stay (HOSPITAL_COMMUNITY)
Admission: AD | Admit: 2018-04-16 | Discharge: 2018-04-20 | DRG: 788 | Disposition: A | Discharge status: Home / Self Care | Payer: Medicaid Other | Attending: Family Medicine | Admitting: Family Medicine

## 2018-04-16 DIAGNOSIS — D649 Anemia, unspecified: Secondary | ICD-10-CM | POA: Diagnosis present

## 2018-04-16 DIAGNOSIS — Z87891 Personal history of nicotine dependence: Secondary | ICD-10-CM

## 2018-04-16 DIAGNOSIS — O99824 Streptococcus B carrier state complicating childbirth: Secondary | ICD-10-CM | POA: Diagnosis present

## 2018-04-16 DIAGNOSIS — Z3A39 39 weeks gestation of pregnancy: Secondary | ICD-10-CM

## 2018-04-16 DIAGNOSIS — O9902 Anemia complicating childbirth: Secondary | ICD-10-CM | POA: Diagnosis present

## 2018-04-16 DIAGNOSIS — O1092 Unspecified pre-existing hypertension complicating childbirth: Secondary | ICD-10-CM | POA: Diagnosis not present

## 2018-04-16 DIAGNOSIS — R1011 Right upper quadrant pain: Secondary | ICD-10-CM | POA: Diagnosis present

## 2018-04-16 DIAGNOSIS — O134 Gestational [pregnancy-induced] hypertension without significant proteinuria, complicating childbirth: Secondary | ICD-10-CM | POA: Diagnosis present

## 2018-04-16 DIAGNOSIS — O99214 Obesity complicating childbirth: Secondary | ICD-10-CM | POA: Diagnosis present

## 2018-04-16 DIAGNOSIS — O99820 Streptococcus B carrier state complicating pregnancy: Secondary | ICD-10-CM

## 2018-04-16 LAB — PROTEIN / CREATININE RATIO, URINE
Creatinine, Urine: 187 mg/dL
Protein Creatinine Ratio: 0.09 mg/mg{Cre} (ref 0.00–0.15)
Total Protein, Urine: 17 mg/dL

## 2018-04-16 LAB — URINALYSIS, ROUTINE W REFLEX MICROSCOPIC
Glucose, UA: NEGATIVE mg/dL
Hgb urine dipstick: NEGATIVE
Ketones, ur: 15 mg/dL — AB
Leukocytes, UA: NEGATIVE
Nitrite: NEGATIVE
Protein, ur: NEGATIVE mg/dL
Specific Gravity, Urine: 1.01 (ref 1.005–1.030)
pH: 6 (ref 5.0–8.0)

## 2018-04-16 LAB — COMPREHENSIVE METABOLIC PANEL
ALT: 13 U/L (ref 0–44)
AST: 20 U/L (ref 15–41)
Albumin: 2.4 g/dL — ABNORMAL LOW (ref 3.5–5.0)
Alkaline Phosphatase: 145 U/L — ABNORMAL HIGH (ref 38–126)
Anion gap: 6 (ref 5–15)
BUN: 6 mg/dL (ref 6–20)
CO2: 22 mmol/L (ref 22–32)
CREATININE: 0.55 mg/dL (ref 0.44–1.00)
Calcium: 7.8 mg/dL — ABNORMAL LOW (ref 8.9–10.3)
Chloride: 107 mmol/L (ref 98–111)
GFR calc Af Amer: >60 mL/min (ref >60)
GFR calc non Af Amer: >60 mL/min (ref >60)
Glucose, Bld: 76 mg/dL (ref 70–99)
Potassium: 3.6 mmol/L (ref 3.5–5.1)
Sodium: 135 mmol/L (ref 135–145)
Total Bilirubin: 1 mg/dL (ref 0.3–1.2)
Total Protein: 6.7 g/dL (ref 6.5–8.1)

## 2018-04-16 LAB — TYPE AND SCREEN
ABO/RH(D): O POS
ANTIBODY SCREEN: NEGATIVE

## 2018-04-16 LAB — CBC
HCT: 31.2 % — ABNORMAL LOW (ref 36.0–46.0)
Hemoglobin: 9.8 g/dL — ABNORMAL LOW (ref 12.0–15.0)
MCH: 28.8 pg (ref 26.0–34.0)
MCHC: 31.4 g/dL (ref 30.0–36.0)
MCV: 91.8 fL (ref 80.0–100.0)
Platelets: 204 10*3/uL (ref 150–400)
RBC: 3.4 MIL/uL — AB (ref 3.87–5.11)
RDW: 14.3 % (ref 11.5–15.5)
WBC: 9.8 10*3/uL (ref 4.0–10.5)
nRBC: 0 % (ref 0.0–0.2)

## 2018-04-16 MED ORDER — FLEET ENEMA 7-19 GM/118ML RE ENEM: 1.0000 | ENEMA | RECTAL | Status: DC | PRN

## 2018-04-16 MED ORDER — LIDOCAINE HCL (PF) 1 % IJ SOLN: 30.0000 mL | INTRAMUSCULAR | Status: DC | PRN

## 2018-04-16 MED ORDER — OXYTOCIN BOLUS FROM INFUSION: 500.0000 mL | Freq: Once | INTRAVENOUS | Status: DC

## 2018-04-16 MED ORDER — OXYCODONE-ACETAMINOPHEN 5-325 MG PO TABS: 1.0000 | ORAL_TABLET | ORAL | Status: DC | PRN

## 2018-04-16 MED ORDER — SOD CITRATE-CITRIC ACID 500-334 MG/5ML PO SOLN: 30.0000 mL | ORAL | Status: DC | PRN

## 2018-04-16 MED ORDER — SODIUM CHLORIDE 0.9 % IV SOLN
5.0000 10*6.[IU] | Freq: Once | INTRAVENOUS | Status: AC
  Administered 2018-04-16: 5 10*6.[IU] via INTRAVENOUS
  Filled 2018-04-16: qty 5

## 2018-04-16 MED ORDER — LACTATED RINGERS IV SOLN
500.0000 mL | INTRAVENOUS | Status: DC | PRN
  Administered 2018-04-17: 500 mL via INTRAVENOUS

## 2018-04-16 MED ORDER — ONDANSETRON HCL 4 MG/2ML IJ SOLN: 4.0000 mg | Freq: Four times a day (QID) | INTRAMUSCULAR | Status: DC | PRN

## 2018-04-16 MED ORDER — OXYCODONE-ACETAMINOPHEN 5-325 MG PO TABS: 2.0000 | ORAL_TABLET | ORAL | Status: DC | PRN

## 2018-04-16 MED ORDER — MISOPROSTOL 50MCG HALF TABLET
50.0000 ug | ORAL_TABLET | Freq: Once | ORAL | Status: AC
  Administered 2018-04-16: 50 ug via ORAL
  Filled 2018-04-16: qty 1

## 2018-04-16 MED ORDER — OXYTOCIN 40 UNITS IN NORMAL SALINE INFUSION - SIMPLE MED
2.5000 [IU]/h | INTRAVENOUS | Status: DC
  Filled 2018-04-16: qty 1000

## 2018-04-16 MED ORDER — LACTATED RINGERS IV SOLN
INTRAVENOUS | Status: DC
  Administered 2018-04-17 (×2): via INTRAVENOUS

## 2018-04-16 MED ORDER — ACETAMINOPHEN 325 MG PO TABS: 650.0000 mg | ORAL_TABLET | ORAL | Status: DC | PRN

## 2018-04-16 MED ORDER — PENICILLIN G 3 MILLION UNITS IVPB - SIMPLE MED
3.0000 10*6.[IU] | INTRAVENOUS | Status: DC
  Administered 2018-04-17 (×2): 3 10*6.[IU] via INTRAVENOUS
  Filled 2018-04-16 (×4): qty 100

## 2018-04-16 NOTE — Progress Notes (Signed)
Monica Jimenez is a 28 y.o. G21P2002 female @39 .5 wks who presents to MAU today for RUQ pain. Pain started 4 days ago. Denies HA, visual disturbances, and CP. Reports good FM. No VB, LOF, or regular ctx.   BP 136/75   Pulse (!) 103   Temp 98.5 F (36.9 C)   Resp 20   Ht 5\' 1"  (1.549 m)   Wt (!) 136.1 kg   LMP 06/30/2017   SpO2 99%   BMI 56.69 kg/m   Patient Vitals for the past 24 hrs:  BP Temp Pulse Resp SpO2 Height Weight  04/16/18 1917 118/69 - 90 - - - -  04/16/18 1902 (!) 147/72 - (!) 109 - - 5\' 1"  (1.549 m) (!) 136.1 kg  04/16/18 1855 136/75 98.5 F (36.9 C) (!) 103 20 99 % - -   CONSTITUTIONAL: Well-developed, well-nourished female in no acute distress.  MUSCULOSKELETAL: Normal range of motion.  CARDIOVASCULAR: Regular heart rate RESPIRATORY: Normal effort NEUROLOGICAL: Alert and oriented to person, place, and time.  SKIN: No pallor. PSYCH: Normal mood and affect. Normal behavior. Normal judgment and thought content. EFM: 140 bpm, mod variability, + accels, no decels Toco: rare  No results found for this or any previous visit (from the past 24 hour(s)).  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 HTN again today confirms gHTN, will admit for IOL. Vtx by [place]. Dr. Dareen Piano notified.  A: [redacted] weeks gestation GHTN  P: Admit to BS IOL Mngt per labor team PEC labs pending   Donette Larry, [place]  04/16/2018 7:21 PM

## 2018-04-16 NOTE — MAU Note (Addendum)
Pt presents to MAU with complaints of pain in her right upper abdomen  since Friday Denies any VB or LOF. + FM

## 2018-04-16 NOTE — H&P (Signed)
LABOR AND DELIVERY ADMISSION HISTORY AND PHYSICAL NOTE  Monica Jimenez is a 28 y.o. female 453P2002 with IUP at 296w5d by 1st-trimester US presenting for Right Upper Abdominal pain since Friday 04/12/2018. States that pain has been persistent and worsening, which is why she went in to be seen. She has had periodic elevation of her blood pressure during pregnancy, but repeat checks have usually been normal. However, blood pressures were elevated on presentation for pain, which is why she was sent here for induction.  She reports positive fetal movement. She denies leakage of fluid or vaginal bleeding.  P[facility] History/Complications[facility] Pregnancy complications:  - UTI in pregnancy 09/06/2017; recheck 11/22/2017 negative - Anemia - Hgb 9.8 on admission - Hx of Gallstones - Obesity - BMI 50+  Past Medical History: Past Medical History:  Diagnosis Date  . Anemia   . Gallstones   . Hemorrhoids   . HPV in female 2014  . Obesity, morbid, BMI 50 or higher (HCC)   . Trichomoniasis   . Vaginal Pap smear, abnormal     Past Surgical History: Past Surgical History:  Procedure Laterality Date  . EXCISION VAGINAL CYST      Obstetrical History: OB History    Gravida  3   Para  2   Term  2   Preterm  0   AB  0   Living  2     SAB  0   TAB  0   Ectopic  0   Multiple  0   Live Births  2           Social History: Social History   Socioeconomic History  . Marital status: Single    Spouse name: Not on file  . Number of children: Not on file  . Years of education: Not on file  . Highest education level: Not on file  Occupational History  . Not on file  Social Needs  . Financial resource strain: Not on file  . Food insecurity:    Worry: Not on file    Inability: Not on file  . Transportation needs:    Medical: Not on file    Non-medical: Not on file  Tobacco Use  . Smoking status: Former Games developermoker  . Smokeless tobacco: Never Used  . Tobacco comment:  marijuana, last 2 yrs ago  Substance and Sexual Activity  . Alcohol use: No    Comment: Ocassionaly  . Drug use: Not Currently    Types: Marijuana  . Sexual activity: Yes    Birth control/protection: None  Lifestyle  . Physical activity:    Days per week: Not on file    Minutes per session: Not on file  . Stress: Not on file  Relationships  . Social connections:    Talks on phone: Not on file    Gets together: Not on file    Attends religious service: Not on file    Active member of club or organization: Not on file    Attends meetings of clubs or organizations: Not on file    Relationship status: Not on file  Other Topics Concern  . Not on file  Social History Narrative  . Not on file    Family History: Family History  Problem Relation Age of Onset  . Hypertension Mother   . Stroke Mother   . Stroke Father   . Diabetes Maternal Grandmother   . Alcohol abuse Neg Hx   . Arthritis Neg Hx   .  Asthma Neg Hx   . Birth defects Neg Hx   . Cancer Neg Hx   . COPD Neg Hx   . Depression Neg Hx   . Drug abuse Neg Hx   . Early death Neg Hx   . Hearing loss Neg Hx   . Heart disease Neg Hx   . Hyperlipidemia Neg Hx   . Kidney disease Neg Hx   . Learning disabilities Neg Hx   . Mental illness Neg Hx   . Mental retardation Neg Hx   . Miscarriages / Stillbirths Neg Hx   . Vision loss Neg Hx   . Varicose Veins Neg Hx    Allergies: No Known Allergies - Confirmed  Medications Prior to Admission  Medication Sig Dispense Refill Last Dose  . benzonatate (TESSALON) 100 MG capsule Take 1-2 capsules (100-200 mg total) by mouth 2 (two) times daily as needed for cough. 40 capsule 0    Review of Systems  All systems reviewed and negative except as stated in HPI  Physical Exam Blood pressure 136/75, pulse (!) 103, temperature 98.5 F (36.9 C), resp. rate 20, height 5\' 1"  (1.549 m), weight (!) 136.1 kg, last menstrual period 06/30/2017, SpO2 99 %, not currently  breastfeeding. General appearance: alert, oriented, NAD Lungs: normal respiratory effort Heart: regular rate Abdomen: soft, non-tender; gravid, FH appropriate for GA Extremities: No calf swelling or tenderness Presentation: cephalic Fetal monitoring: 145/mod var/+ acels/no decels Uterine activity: irritability     Prenatal labs: ABO, Rh:  O-Pos (09/06/2017) Antibody:  Neg (09/06/2017) Rubella:  Immune (09/06/2017) RPR:   Negative (01/31/2018) HBsAg:   Negative 09/06/2017) HIV:   nonreactive (02/28/2018) GC/Chlamydia: negative/negative (01/31/2018) GBS:   Positive (03/21/2018) 1-hr GTT: 92 wnl Genetic screening:  CF negative, otherwise not tested Anatomy US: normal anatomy  Prenatal Transfer Tool  Maternal Diabetes: No Genetic Screening: Normal Maternal Ultrasounds/Referrals: Normal Fetal Ultrasounds or other Referrals:  None Maternal Substance Abuse:  No Significant Maternal Medications:  None Significant Maternal Lab Results: None  No results found for this or any previous visit (from the past 24 hour(s)).  Patient Active Problem List   Diagnosis Date Noted  . Uterine contractions during pregnancy 07/27/2016  . Palpitations 06/29/2016    Assessment: Monica KaufmannCrystal Jimenez is a 28 y.o. G3P2002 at 6082w5d here for IOL for HTN.  #Labor: Latent #Pain: Desires epidural #FWB: Cat 1 #ID:  GBS Positive; PCN #MOF: bottle #MOC: POP #Circ:  NA  Dollene ClevelandHannah C Anderson 04/16/2018, 7:23 PM

## 2018-04-17 ENCOUNTER — Encounter (HOSPITAL_COMMUNITY)
Admission: AD | Disposition: A | Discharge status: Home / Self Care | Payer: Self-pay | Source: Home / Self Care | Attending: Family Medicine

## 2018-04-17 ENCOUNTER — Encounter (HOSPITAL_COMMUNITY): Payer: Self-pay

## 2018-04-17 ENCOUNTER — Inpatient Hospital Stay (HOSPITAL_COMMUNITY): Payer: Medicaid Other | Admitting: Anesthesiology

## 2018-04-17 DIAGNOSIS — O1092 Unspecified pre-existing hypertension complicating childbirth: Secondary | ICD-10-CM

## 2018-04-17 DIAGNOSIS — O9902 Anemia complicating childbirth: Secondary | ICD-10-CM

## 2018-04-17 DIAGNOSIS — O99214 Obesity complicating childbirth: Secondary | ICD-10-CM

## 2018-04-17 DIAGNOSIS — O99824 Streptococcus B carrier state complicating childbirth: Secondary | ICD-10-CM

## 2018-04-17 LAB — CBC
HCT: 30 % — ABNORMAL LOW (ref 36.0–46.0)
HCT: 26.6 % — ABNORMAL LOW (ref 36.0–46.0)
Hemoglobin: 9.7 g/dL — ABNORMAL LOW (ref 12.0–15.0)
Hemoglobin: 8.6 g/dL — ABNORMAL LOW (ref 12.0–15.0)
MCH: 29.4 pg (ref 26.0–34.0)
MCH: 29.6 pg (ref 26.0–34.0)
MCHC: 32.3 g/dL (ref 30.0–36.0)
MCHC: 32.3 g/dL (ref 30.0–36.0)
MCV: 90.9 fL (ref 80.0–100.0)
MCV: 91.4 fL (ref 80.0–100.0)
Platelets: 192 10*3/uL (ref 150–400)
Platelets: 185 10*3/uL (ref 150–400)
RBC: 3.3 MIL/uL — ABNORMAL LOW (ref 3.87–5.11)
RBC: 2.91 MIL/uL — AB (ref 3.87–5.11)
RDW: 14.3 % (ref 11.5–15.5)
RDW: 14.1 % (ref 11.5–15.5)
WBC: 12.6 10*3/uL — ABNORMAL HIGH (ref 4.0–10.5)
WBC: 14.1 10*3/uL — ABNORMAL HIGH (ref 4.0–10.5)
nRBC: 0 % (ref 0.0–0.2)
nRBC: 0 % (ref 0.0–0.2)

## 2018-04-17 LAB — RPR: RPR Ser Ql: NONREACTIVE

## 2018-04-17 SURGERY — Surgical Case
Anesthesia: Epidural | Site: Abdomen | Wound class: Clean Contaminated

## 2018-04-17 MED ORDER — FENTANYL CITRATE (PF) 100 MCG/2ML IJ SOLN
INTRAMUSCULAR | Status: AC
  Filled 2018-04-17: qty 2

## 2018-04-17 MED ORDER — IBUPROFEN 600 MG PO TABS: 600.0000 mg | ORAL_TABLET | Freq: Four times a day (QID) | ORAL | Status: DC | PRN

## 2018-04-17 MED ORDER — IBUPROFEN 600 MG PO TABS
600.0000 mg | ORAL_TABLET | Freq: Four times a day (QID) | ORAL | Status: DC
  Administered 2018-04-17 – 2018-04-20 (×13): 600 mg via ORAL
  Filled 2018-04-17 (×13): qty 1

## 2018-04-17 MED ORDER — NALOXONE HCL 0.4 MG/ML IJ SOLN: 0.4000 mg | INTRAMUSCULAR | Status: DC | PRN

## 2018-04-17 MED ORDER — TERBUTALINE SULFATE 1 MG/ML IJ SOLN: 0.2500 mg | Freq: Once | INTRAMUSCULAR | Status: DC | PRN

## 2018-04-17 MED ORDER — EPHEDRINE 5 MG/ML INJ: 10.0000 mg | INTRAVENOUS | Status: DC | PRN

## 2018-04-17 MED ORDER — WITCH HAZEL-GLYCERIN EX PADS: 1.0000 "application " | MEDICATED_PAD | CUTANEOUS | Status: DC | PRN

## 2018-04-17 MED ORDER — LACTATED RINGERS IV SOLN
INTRAVENOUS | Status: DC
  Administered 2018-04-17 – 2018-04-18 (×3): 125 mL/h via INTRAVENOUS

## 2018-04-17 MED ORDER — NALBUPHINE HCL 10 MG/ML IJ SOLN: 5.0000 mg | Freq: Once | INTRAMUSCULAR | Status: DC | PRN

## 2018-04-17 MED ORDER — ONDANSETRON HCL 4 MG/2ML IJ SOLN
INTRAMUSCULAR | Status: AC
  Filled 2018-04-17: qty 4

## 2018-04-17 MED ORDER — DEXAMETHASONE SODIUM PHOSPHATE 4 MG/ML IJ SOLN
INTRAMUSCULAR | Status: AC
  Filled 2018-04-17: qty 1

## 2018-04-17 MED ORDER — SIMETHICONE 80 MG PO CHEW
80.0000 mg | CHEWABLE_TABLET | Freq: Three times a day (TID) | ORAL | Status: DC
  Administered 2018-04-17 – 2018-04-20 (×9): 80 mg via ORAL
  Filled 2018-04-17 (×8): qty 1

## 2018-04-17 MED ORDER — DEXTROSE 5 % IV SOLN
INTRAVENOUS | Status: AC
  Filled 2018-04-17: qty 3000

## 2018-04-17 MED ORDER — OXYCODONE HCL 5 MG/5ML PO SOLN: 5.0000 mg | Freq: Once | ORAL | Status: DC | PRN

## 2018-04-17 MED ORDER — DIPHENHYDRAMINE HCL 50 MG/ML IJ SOLN: 12.5000 mg | INTRAMUSCULAR | Status: DC | PRN

## 2018-04-17 MED ORDER — FENTANYL CITRATE (PF) 100 MCG/2ML IJ SOLN
100.0000 ug | INTRAMUSCULAR | Status: DC | PRN
  Administered 2018-04-17: 100 ug via INTRAVENOUS
  Filled 2018-04-17: qty 2

## 2018-04-17 MED ORDER — SODIUM CHLORIDE 0.9% FLUSH: 3.0000 mL | INTRAVENOUS | Status: DC | PRN

## 2018-04-17 MED ORDER — SODIUM BICARBONATE 8.4 % IV SOLN
INTRAVENOUS | Status: DC | PRN
  Administered 2018-04-17: 3 mL via EPIDURAL
  Administered 2018-04-17: 10 mL via EPIDURAL

## 2018-04-17 MED ORDER — FENTANYL CITRATE (PF) 100 MCG/2ML IJ SOLN
25.0000 ug | INTRAMUSCULAR | Status: DC | PRN
  Administered 2018-04-17: 50 ug via INTRAVENOUS

## 2018-04-17 MED ORDER — STERILE WATER FOR IRRIGATION IR SOLN
Status: DC | PRN
  Administered 2018-04-17: 1000 mL

## 2018-04-17 MED ORDER — OXYTOCIN 10 UNIT/ML IJ SOLN
INTRAMUSCULAR | Status: AC
  Filled 2018-04-17: qty 4

## 2018-04-17 MED ORDER — TETANUS-DIPHTH-ACELL PERTUSSIS 5-2.5-18.5 LF-MCG/0.5 IM SUSP: 0.5000 mL | Freq: Once | INTRAMUSCULAR | Status: DC

## 2018-04-17 MED ORDER — SCOPOLAMINE 1 MG/3DAYS TD PT72
MEDICATED_PATCH | TRANSDERMAL | Status: AC
  Filled 2018-04-17: qty 1

## 2018-04-17 MED ORDER — ONDANSETRON HCL 4 MG/2ML IJ SOLN: 4.0000 mg | Freq: Three times a day (TID) | INTRAMUSCULAR | Status: DC | PRN

## 2018-04-17 MED ORDER — SODIUM CHLORIDE 0.9 % IR SOLN
Status: DC | PRN
  Administered 2018-04-17: 1000 mL

## 2018-04-17 MED ORDER — COCONUT OIL OIL: 1.0000 "application " | TOPICAL_OIL | Status: DC | PRN

## 2018-04-17 MED ORDER — LACTATED RINGERS IV SOLN
INTRAVENOUS | Status: DC | PRN
  Administered 2018-04-17: 07:00:00 via INTRAVENOUS

## 2018-04-17 MED ORDER — MORPHINE SULFATE (PF) 0.5 MG/ML IJ SOLN
INTRAMUSCULAR | Status: DC | PRN
  Administered 2018-04-17: 3 mg via EPIDURAL

## 2018-04-17 MED ORDER — OXYTOCIN 40 UNITS IN NORMAL SALINE INFUSION - SIMPLE MED: 2.5000 [IU]/h | INTRAVENOUS | Status: AC

## 2018-04-17 MED ORDER — PHENYLEPHRINE 40 MCG/ML (10ML) SYRINGE FOR IV PUSH (FOR BLOOD PRESSURE SUPPORT): 80.0000 ug | PREFILLED_SYRINGE | INTRAVENOUS | Status: DC | PRN

## 2018-04-17 MED ORDER — NALBUPHINE HCL 10 MG/ML IJ SOLN: 5.0000 mg | INTRAMUSCULAR | Status: DC | PRN

## 2018-04-17 MED ORDER — DIPHENHYDRAMINE HCL 25 MG PO CAPS
25.0000 mg | ORAL_CAPSULE | ORAL | Status: DC | PRN
  Filled 2018-04-17: qty 1

## 2018-04-17 MED ORDER — PRENATAL MULTIVITAMIN CH
1.0000 | ORAL_TABLET | Freq: Every day | ORAL | Status: DC
  Administered 2018-04-17 – 2018-04-20 (×4): 1 via ORAL
  Filled 2018-04-17 (×4): qty 1

## 2018-04-17 MED ORDER — SODIUM CHLORIDE 0.9 % IV SOLN
500.0000 mg | Freq: Once | INTRAVENOUS | Status: AC
  Administered 2018-04-17: 500 mg via INTRAVENOUS
  Filled 2018-04-17: qty 500

## 2018-04-17 MED ORDER — SODIUM BICARBONATE 8.4 % IV SOLN
INTRAVENOUS | Status: AC
  Filled 2018-04-17: qty 50

## 2018-04-17 MED ORDER — DIBUCAINE 1 % RE OINT: 1.0000 "application " | TOPICAL_OINTMENT | RECTAL | Status: DC | PRN

## 2018-04-17 MED ORDER — ENOXAPARIN SODIUM 80 MG/0.8ML ~~LOC~~ SOLN
70.0000 mg | SUBCUTANEOUS | Status: DC
  Administered 2018-04-18 – 2018-04-20 (×3): 70 mg via SUBCUTANEOUS
  Filled 2018-04-17 (×4): qty 0.8

## 2018-04-17 MED ORDER — SIMETHICONE 80 MG PO CHEW: 80.0000 mg | CHEWABLE_TABLET | ORAL | Status: DC | PRN

## 2018-04-17 MED ORDER — OXYTOCIN 10 UNIT/ML IJ SOLN
INTRAVENOUS | Status: DC | PRN
  Administered 2018-04-17: 40 [IU] via INTRAVENOUS

## 2018-04-17 MED ORDER — DEXTROSE 5 % IV SOLN
INTRAVENOUS | Status: DC | PRN
  Administered 2018-04-17: 3 g via INTRAVENOUS

## 2018-04-17 MED ORDER — ACETAMINOPHEN 500 MG PO TABS
1000.0000 mg | ORAL_TABLET | Freq: Four times a day (QID) | ORAL | Status: AC
  Administered 2018-04-17 – 2018-04-18 (×4): 1000 mg via ORAL
  Filled 2018-04-17 (×4): qty 2

## 2018-04-17 MED ORDER — LACTATED RINGERS IV SOLN
500.0000 mL | Freq: Once | INTRAVENOUS | Status: AC
  Administered 2018-04-17: 500 mL via INTRAVENOUS

## 2018-04-17 MED ORDER — SIMETHICONE 80 MG PO CHEW
80.0000 mg | CHEWABLE_TABLET | ORAL | Status: DC
  Administered 2018-04-18 – 2018-04-20 (×3): 80 mg via ORAL
  Filled 2018-04-17 (×3): qty 1

## 2018-04-17 MED ORDER — KETOROLAC TROMETHAMINE 30 MG/ML IJ SOLN: 30.0000 mg | Freq: Four times a day (QID) | INTRAMUSCULAR | Status: DC

## 2018-04-17 MED ORDER — MEPERIDINE HCL 25 MG/ML IJ SOLN: 6.2500 mg | INTRAMUSCULAR | Status: DC | PRN

## 2018-04-17 MED ORDER — FENTANYL 2.5 MCG/ML BUPIVACAINE 1/10 % EPIDURAL INFUSION (WH - ANES)
14.0000 mL/h | INTRAMUSCULAR | Status: DC | PRN
  Administered 2018-04-17: 14 mL/h via EPIDURAL
  Filled 2018-04-17: qty 100

## 2018-04-17 MED ORDER — OXYTOCIN 40 UNITS IN NORMAL SALINE INFUSION - SIMPLE MED
1.0000 m[IU]/min | INTRAVENOUS | Status: DC
  Administered 2018-04-17: 2 m[IU]/min via INTRAVENOUS

## 2018-04-17 MED ORDER — PHENYLEPHRINE 40 MCG/ML (10ML) SYRINGE FOR IV PUSH (FOR BLOOD PRESSURE SUPPORT)
80.0000 ug | PREFILLED_SYRINGE | INTRAVENOUS | Status: DC | PRN
  Filled 2018-04-17: qty 10

## 2018-04-17 MED ORDER — ONDANSETRON HCL 4 MG/2ML IJ SOLN
INTRAMUSCULAR | Status: DC | PRN
  Administered 2018-04-17: 4 mg via INTRAVENOUS

## 2018-04-17 MED ORDER — MORPHINE SULFATE (PF) 0.5 MG/ML IJ SOLN
INTRAMUSCULAR | Status: AC
  Filled 2018-04-17: qty 10

## 2018-04-17 MED ORDER — OXYCODONE-ACETAMINOPHEN 5-325 MG PO TABS
1.0000 | ORAL_TABLET | ORAL | Status: DC | PRN
  Administered 2018-04-18: 2 via ORAL
  Administered 2018-04-18: 1 via ORAL
  Administered 2018-04-19 – 2018-04-20 (×4): 2 via ORAL
  Administered 2018-04-20: 1 via ORAL
  Filled 2018-04-17: qty 2
  Filled 2018-04-17: qty 1
  Filled 2018-04-17 (×4): qty 2
  Filled 2018-04-17: qty 1

## 2018-04-17 MED ORDER — DEXAMETHASONE SODIUM PHOSPHATE 10 MG/ML IJ SOLN
INTRAMUSCULAR | Status: DC | PRN
  Administered 2018-04-17: 4 mg via INTRAVENOUS

## 2018-04-17 MED ORDER — NALOXONE HCL 4 MG/10ML IJ SOLN
1.0000 ug/kg/h | INTRAVENOUS | Status: DC | PRN
  Filled 2018-04-17: qty 5

## 2018-04-17 MED ORDER — OXYCODONE HCL 5 MG PO TABS: 5.0000 mg | ORAL_TABLET | Freq: Once | ORAL | Status: DC | PRN

## 2018-04-17 MED ORDER — MENTHOL 3 MG MT LOZG: 1.0000 | LOZENGE | OROMUCOSAL | Status: DC | PRN

## 2018-04-17 MED ORDER — SCOPOLAMINE 1 MG/3DAYS TD PT72
1.0000 | MEDICATED_PATCH | Freq: Once | TRANSDERMAL | Status: AC
  Administered 2018-04-17: 1.5 mg via TRANSDERMAL

## 2018-04-17 MED ORDER — ZOLPIDEM TARTRATE 5 MG PO TABS: 5.0000 mg | ORAL_TABLET | Freq: Every evening | ORAL | Status: DC | PRN

## 2018-04-17 MED ORDER — DIPHENHYDRAMINE HCL 25 MG PO CAPS: 25.0000 mg | ORAL_CAPSULE | Freq: Four times a day (QID) | ORAL | Status: DC | PRN

## 2018-04-17 MED ORDER — LIDOCAINE HCL (PF) 1 % IJ SOLN
INTRAMUSCULAR | Status: DC | PRN
  Administered 2018-04-17: 5 mL via EPIDURAL

## 2018-04-17 MED ORDER — SENNOSIDES-DOCUSATE SODIUM 8.6-50 MG PO TABS
2.0000 | ORAL_TABLET | ORAL | Status: DC
  Administered 2018-04-18: 2 via ORAL
  Filled 2018-04-17 (×3): qty 2

## 2018-04-17 MED ORDER — LIDOCAINE-EPINEPHRINE (PF) 2 %-1:200000 IJ SOLN
INTRAMUSCULAR | Status: AC
  Filled 2018-04-17: qty 20

## 2018-04-17 MED ORDER — LACTATED RINGERS IV BOLUS
500.0000 mL | Freq: Once | INTRAVENOUS | Status: AC
  Administered 2018-04-17: 500 mL via INTRAVENOUS

## 2018-04-17 MED ORDER — ONDANSETRON HCL 4 MG/2ML IJ SOLN: 4.0000 mg | Freq: Once | INTRAMUSCULAR | Status: DC | PRN

## 2018-04-17 MED ORDER — LACTATED RINGERS IV BOLUS
1000.0000 mL | Freq: Once | INTRAVENOUS | Status: AC
  Administered 2018-04-17: 1000 mL via INTRAVENOUS

## 2018-04-17 SURGICAL SUPPLY — 32 items
BENZOIN TINCTURE PRP APPL 2/3 (GAUZE/BANDAGES/DRESSINGS) ×3 IMPLANT
CLAMP CORD UMBIL (MISCELLANEOUS) ×3 IMPLANT
CLOSURE WOUND 1/2 X4 (GAUZE/BANDAGES/DRESSINGS) ×1
CLOTH BEACON ORANGE TIMEOUT ST (SAFETY) ×3 IMPLANT
DRSG OPSITE POSTOP 4X10 (GAUZE/BANDAGES/DRESSINGS) ×3 IMPLANT
ELECT REM PT RETURN 9FT ADLT (ELECTROSURGICAL) ×3
ELECTRODE REM PT RTRN 9FT ADLT (ELECTROSURGICAL) ×1 IMPLANT
GAUZE SPONGE 4X4 12PLY STRL LF (GAUZE/BANDAGES/DRESSINGS) ×6 IMPLANT
GLOVE BIOGEL PI IND STRL 7.0 (GLOVE) ×2 IMPLANT
GLOVE BIOGEL PI IND STRL 7.5 (GLOVE) ×2 IMPLANT
GLOVE BIOGEL PI INDICATOR 7.0 (GLOVE) ×4
GLOVE BIOGEL PI INDICATOR 7.5 (GLOVE) ×4
GLOVE ECLIPSE 7.5 STRL STRAW (GLOVE) ×3 IMPLANT
GOWN STRL REUS W/TWL LRG LVL3 (GOWN DISPOSABLE) ×9 IMPLANT
KIT ABG SYR 3ML LUER SLIP (SYRINGE) ×3 IMPLANT
NEEDLE HYPO 25X5/8 SAFETYGLIDE (NEEDLE) ×3 IMPLANT
NS IRRIG 1000ML POUR BTL (IV SOLUTION) ×3 IMPLANT
PACK C SECTION WH (CUSTOM PROCEDURE TRAY) ×3 IMPLANT
PAD ABD DERMACEA PRESS 5X9 (GAUZE/BANDAGES/DRESSINGS) ×3 IMPLANT
PAD OB MATERNITY 4.3X12.25 (PERSONAL CARE ITEMS) ×3 IMPLANT
PENCIL SMOKE EVAC W/HOLSTER (ELECTROSURGICAL) ×3 IMPLANT
RTRCTR C-SECT PINK 25CM LRG (MISCELLANEOUS) ×3 IMPLANT
STRIP CLOSURE SKIN 1/2X4 (GAUZE/BANDAGES/DRESSINGS) ×2 IMPLANT
SUT PLAIN 2 0 XLH (SUTURE) ×3 IMPLANT
SUT VIC AB 0 CTX 36 (SUTURE) ×6
SUT VIC AB 0 CTX36XBRD ANBCTRL (SUTURE) ×3 IMPLANT
SUT VIC AB 2-0 CT1 27 (SUTURE) ×2
SUT VIC AB 2-0 CT1 TAPERPNT 27 (SUTURE) ×1 IMPLANT
SUT VIC AB 4-0 KS 27 (SUTURE) ×3 IMPLANT
TOWEL OR 17X24 6PK STRL BLUE (TOWEL DISPOSABLE) ×3 IMPLANT
TRAY FOLEY W/BAG SLVR 14FR LF (SET/KITS/TRAYS/PACK) ×3 IMPLANT
WATER STERILE IRR 1000ML POUR (IV SOLUTION) ×3 IMPLANT

## 2018-04-17 NOTE — Transfer of Care (Signed)
Immediate Anesthesia Transfer of Care Note  Patient: Monica Jimenez  Procedure(s) Performed: CESAREAN SECTION (N/A Abdomen)  Patient Location: PACU  Anesthesia Type:Epidural  Level of Consciousness: awake, alert  and oriented  Airway & Oxygen Therapy: Patient Spontanous Breathing  Post-op Assessment: Report given to RN and Post -op Vital signs reviewed and stable  Post vital signs: Reviewed and stable  Last Vitals:  Vitals Value Taken Time  BP    Temp    Pulse 88 04/17/2018  8:02 AM  Resp 13 04/17/2018  8:02 AM  SpO2 100 % 04/17/2018  8:02 AM  Vitals shown include unvalidated device data.  Last Pain:  Vitals:   04/17/18 0625  TempSrc: Oral  PainSc:          Complications: No apparent anesthesia complications

## 2018-04-17 NOTE — Significant Event (Signed)
Was called into patient's room because she was bleeding significantly. Initially unknown whether, bleeding was vaginal or from Cesarean scar. Diaper and dressings were removed and bleeding area was found to be 5cm from the end of the scar on the patient's right. Hemostatic pressure was applied to the area and controlled after 4-5 minutes. Steri-strips and a honeycomb bandage were applied to the area. Patient denied lightheadedness, dizziness, nausea, HA, weakness, SOB. Will re-examine and apply a pressure dressing at that time.  Wilnette Kales, DO 04/17/18 3:01 PM

## 2018-04-17 NOTE — Progress Notes (Signed)
LABOR PROGRESS NOTE  Monica Jimenez is a 28 y.o. G3P2002 at [redacted]w[redacted]d admitted for IOL for gHTN  Subjective: Feeling more pain with contractions, which improved with fentanyl   Objective: BP 139/77   Pulse 78   Temp 98.6 F (37 C) (Oral)   Resp 16   Ht 5\' 1"  (1.549 m)   Wt (!) 136.1 kg   LMP 06/30/2017   SpO2 99%   BMI 56.69 kg/m  or  Vitals:   04/17/18 0030 04/17/18 0130 04/17/18 0215 04/17/18 0216  BP: 140/80 128/72 139/77   Pulse: 82 77 78   Resp:      Temp:    98.6 F (37 C)  TempSrc:    Oral  SpO2:      Weight:      Height:        Dilation: 4 Effacement (%): 50 Station: -3 Presentation: Vertex Exam by:: B Holliday RN FHT: baseline rate 140, minimal varibility, + acel, variable decel Toco: regular q3-29m  Labs: Lab Results  Component Value Date   WBC 9.8 04/16/2018   HGB 9.8 (L) 04/16/2018   HCT 31.2 (L) 04/16/2018   MCV 91.8 04/16/2018   PLT 204 04/16/2018    Patient Active Problem List   Diagnosis Date Noted  . Indication for care in labor or delivery 04/16/2018  . Uterine contractions during pregnancy 07/27/2016  . Palpitations 06/29/2016    Assessment / Plan: 28 y.o. G3P2002 at [redacted]w[redacted]d here for IOL for gHTN  Labor: AROM and internalize. Clear fluid Fetal Wellbeing:  Cat 2 (reassuring) Pain Control:  Fentanyl  Anticipated MOD:  SVD  Gwenevere Abbot, MD  OB Fellow  04/17/2018, 4:01 AM

## 2018-04-17 NOTE — Lactation Note (Signed)
This note was copied from a baby's chart. Lactation Consultation Note  Patient Name: Monica Fransisca KaufmannCrystal Ardoin Today's Date: 04/17/2018  Baby Monica Ashley RoyaltyMatthews at 329 hours old.  Mom reports she has never bf and does not plan to bf with her. Asked about pumping.  Mom reports no just formula bottles., Reviewed CDC powdered formula prep guidelines with mom.  Urged mom to call lactation if changes mind.    Feeding Feeding Type: Bottle Fed - Formula  LATCH Score                   Interventions    Lactation Tools Discussed/Used     Consult Status      Mashayla Lavin Michaelle CopasS Jordi Kamm 04/17/2018, 4:02 PM

## 2018-04-17 NOTE — Op Note (Signed)
Monica Jimenez PROCEDURE DATE: 04/16/2018 - 04/17/2018  PREOPERATIVE DIAGNOSIS: Intrauterine pregnancy at  [redacted]w[redacted]d weeks gestation; non-reassuring fetal status  POSTOPERATIVE DIAGNOSIS: The same  PROCEDURE: Primary Low Transverse Cesarean Section  SURGEON:  Dr. Candelaria Celeste  ASSISTANT: Dr. Gwenevere Abbot  INDICATIONS: Monica Jimenez is a 28 y.o. G3P3003 at [redacted]w[redacted]d scheduled for cesarean section secondary to non-reassuring fetal status.  The risks of cesarean section discussed with the patient included but were not limited to: bleeding which may require transfusion or reoperation; infection which may require antibiotics; injury to bowel, bladder, ureters or other surrounding organs; injury to the fetus; need for additional procedures including hysterectomy in the event of a life-threatening hemorrhage; placental abnormalities wth subsequent pregnancies, incisional problems, thromboembolic phenomenon and other postoperative/anesthesia complications. The patient concurred with the proposed plan, giving informed written consent for the procedure.    FINDINGS:  Viable female infant in cephalic presentation.  Apgars 8 and 9, weight, 8 pounds and 4.4 ounces.  Clear amniotic fluid.  Intact placenta, three vessel cord.  Normal uterus, fallopian tubes and ovaries bilaterally.  ANESTHESIA:    Epidural INTRAVENOUS FLUIDS:1500 ml ESTIMATED BLOOD LOSS: 629 ml URINE OUTPUT:  100 ml SPECIMENS: Placenta sent to pathology COMPLICATIONS: None immediate  PROCEDURE IN DETAIL:  The patient was urgently taken to the the operating room and she was then placed in a dorsal supine position with a leftward tilt, prepped quickly with betadine and draped in a sterile manner. The patient received intravenous antibiotics and had sequential compression devices applied to her lower extremities while in the preoperative area.  Epidural anesthesia was dosed up to surgical level and was found to be adequate.  A foley catheter was  placed into her bladder prior to her arrival and attached to constant gravity, which drained clear fluid throughout.  After an adequate timeout was performed, Pfannenstiel skin incision was made with scalpel and carried through to the underlying layer of fascia. The fascial incision was extended bilaterally in a blunt fashion.  The fascia was separated from underlying rectus muscles bluntly.  The rectus muscles were separated in the midline bluntly and the peritoneum was entered bluntly. Attention was turned to the lower uterine segment where a low transverse hysterotomy was made with a scalpel and extended bilaterally bluntly.  The infant was successfully delivered, the cord was clamped and cut and the infant was handed over to awaiting neonatology team. Incision to delivery time was less than one minute.  The placenta was delivered intact and had a three-vessel cord. The uterus was then cleared of clot and debris.  The hysterotomy was closed with 0 Vicryl in a running locked fashion, and an imbricating layer was also placed with a 0 Vicryl.Figure-of-eight 0 Vicryl serosal stitches were placed to help with hemostasis.  Overall, excellent hemostasis was noted. The abdomen and the pelvis were cleared of all clot and debris and the Jon Gills was removed. Hemostasis was confirmed on all surfaces.  The peritoneum was reapproximated using 2-0 vicryl running stitches. The fascia was then closed using 0 Vicryl in a running fashion. The subcutaneous layer was reapproximated with plain gut and the skin was closed with 4-0 vicryl. The patient tolerated the procedure well. Sponge, lap, instrument and needle counts were correct x 2. She was taken to the recovery room in stable condition.    Gwenevere Abbot, MD 04/17/2018 8:49 AM

## 2018-04-17 NOTE — Progress Notes (Signed)
Dr Philips notified that output had become clearer in color but pt has only put out 200 cc in almost 6 hours.  Orders for bolus given

## 2018-04-17 NOTE — Progress Notes (Signed)
LABOR PROGRESS NOTE  Monica Jimenez is a 28 y.o. G3P2002 at [redacted]w[redacted]d  admitted for IOL for gHTN  Subjective: Feeling more contractions, but pain is mild  Objective: BP 140/80   Pulse 82   Temp 98.3 F (36.8 C) (Oral)   Resp 16   Ht 5\' 1"  (1.549 m)   Wt (!) 136.1 kg   LMP 06/30/2017   SpO2 99%   BMI 56.69 kg/m  or  Vitals:   04/16/18 2211 04/16/18 2304 04/17/18 0000 04/17/18 0030  BP: 126/72 135/86 134/77 140/80  Pulse: 82 75 82 82  Resp:  16    Temp: 98.3 F (36.8 C)     TempSrc: Oral     SpO2:      Weight:      Height:        Dilation: 4 Effacement (%): 50 Station: -3 Presentation: Vertex Exam by:: Aneta Mins, MD FHT: baseline rate 140s, moderate varibility, + acel, no decel (difficult to trace due to maternal habitus) Toco: irregular contractions (difficult to trace due to maternal habitus)  Labs: Lab Results  Component Value Date   WBC 9.8 04/16/2018   HGB 9.8 (L) 04/16/2018   HCT 31.2 (L) 04/16/2018   MCV 91.8 04/16/2018   PLT 204 04/16/2018    Patient Active Problem List   Diagnosis Date Noted  . Indication for care in labor or delivery 04/16/2018  . Uterine contractions during pregnancy 07/27/2016  . Palpitations 06/29/2016    Assessment / Plan: 28 y.o. G3P2002 at [redacted]w[redacted]d here for IOL for gHTN  Labor: progressing. Will start pitocin since pt now favorable. AROM with next check and will place internal monitors for better tracing.  Fetal Wellbeing:  Cat 1 Pain Control:  Comfortable. Desires epidural Anticipated MOD:  SVD  Gwenevere Abbot, MD  OB Fellow  04/17/2018, 1:30 AM

## 2018-04-17 NOTE — Anesthesia Postprocedure Evaluation (Signed)
Anesthesia Post Note  Patient: Monica Jimenez  Procedure(s) Performed: CESAREAN SECTION (N/A Abdomen)     Patient location during evaluation: PACU Anesthesia Type: Epidural Level of consciousness: oriented and awake and alert Pain management: pain level controlled Vital Signs Assessment: post-procedure vital signs reviewed and stable Respiratory status: spontaneous breathing, respiratory function stable and nonlabored ventilation Cardiovascular status: blood pressure returned to baseline and stable Postop Assessment: no headache, no backache, no apparent nausea or vomiting and spinal receding Anesthetic complications: no    Last Vitals:  Vitals:   04/17/18 0900 04/17/18 0915  BP: 107/81 112/89  Pulse: 74 79  Resp: 18 18  Temp:  36.7 C  SpO2: 100% 100%    Last Pain:  Vitals:   04/17/18 0915  TempSrc: Oral  PainSc: 1    Pain Goal:    LLE Motor Response: Purposeful movement, Responds to commands (04/17/18 0900)   RLE Motor Response: Purposeful movement, Responds to commands (04/17/18 0900)       Epidural/Spinal Function Cutaneous sensation: Able to Discern Pressure (04/17/18 0915), Patient able to flex knees: No (04/17/18 0915), Patient able to lift hips off bed: No (04/17/18 0915), Back pain beyond tenderness at insertion site: No (04/17/18 0915), Progressively worsening motor and/or sensory loss: No (04/17/18 0915), Bowel and/or bladder incontinence post epidural: No (04/17/18 0915)  Lucretia Kern

## 2018-04-17 NOTE — Addendum Note (Signed)
Addendum  created 04/17/18 1324 by Cleda Clarks, CRNA   Clinical Note Signed

## 2018-04-17 NOTE — Anesthesia Preprocedure Evaluation (Signed)
Anesthesia Evaluation  Patient identified by MRN, date of birth, ID band Patient awake    Reviewed: Allergy & Precautions, H&P , NPO status , Patient's Chart, lab work & pertinent test results  Airway Mallampati: III  TM Distance: >3 FB Neck ROM: Full    Dental no notable dental hx. (+) Teeth Intact   Pulmonary neg pulmonary ROS, former smoker,    Pulmonary exam normal breath sounds clear to auscultation       Cardiovascular Exercise Tolerance: Good hypertension, negative cardio ROS Normal cardiovascular exam Rhythm:Regular Rate:Normal     Neuro/Psych negative neurological ROS  negative psych ROS   GI/Hepatic   Endo/Other  Morbid obesity  Renal/GU      Musculoskeletal   Abdominal (+) + obese,   Peds  Hematology  (+) Blood dyscrasia, anemia , Hgb 9.7 Plts 192   Anesthesia Other Findings   Reproductive/Obstetrics (+) Pregnancy                             Anesthesia Physical Anesthesia Plan  ASA: IV  Anesthesia Plan: Epidural   Post-op Pain Management:    Induction:   PONV Risk Score and Plan:   Airway Management Planned:   Additional Equipment:   Intra-op Plan:   Post-operative Plan:   Informed Consent: I have reviewed the patients History and Physical, chart, labs and discussed the procedure including the risks, benefits and alternatives for the proposed anesthesia with the patient or authorized representative who has indicated his/her understanding and acceptance.       Plan Discussed with:   Anesthesia Plan Comments:         Anesthesia Quick Evaluation

## 2018-04-17 NOTE — Progress Notes (Signed)
Dr Gevena Cotton notified that pt had decreased urine output and urine amber colored.  Bolus ordered

## 2018-04-17 NOTE — Anesthesia Postprocedure Evaluation (Signed)
Anesthesia Post Note  Patient: Monica Jimenez  Procedure(s) Performed: CESAREAN SECTION (N/A Abdomen)     Patient location during evaluation: Mother Baby Anesthesia Type: Epidural Level of consciousness: awake, awake and alert and oriented Pain management: pain level controlled Vital Signs Assessment: post-procedure vital signs reviewed and stable Respiratory status: spontaneous breathing Cardiovascular status: blood pressure returned to baseline Postop Assessment: no headache, no backache, epidural receding, patient able to bend at knees, no apparent nausea or vomiting, adequate PO intake and able to ambulate Anesthetic complications: no    Last Vitals:  Vitals:   04/17/18 1046 04/17/18 1200  BP: 113/78 118/75  Pulse: 77 82  Resp: 20 18  Temp: 36.8 C 36.9 C  SpO2: 100% 99%    Last Pain:  Vitals:   04/17/18 1200  TempSrc:   PainSc: 0-No pain   Pain Goal:                   Cleda Clarks

## 2018-04-17 NOTE — Progress Notes (Signed)
Was going to check pt and get her oob but she was found with large amt of clots and blood coming from her incision  md called and dr Gevena Cotton came right away     900 cc blood loss   md put pressure on incision with new steris and honey comb  No more bleeding noted

## 2018-04-17 NOTE — Anesthesia Procedure Notes (Signed)
Epidural Patient location during procedure: OB Start time: 04/17/2018 5:24 AM End time: 04/17/2018 5:44 AM  Staffing Anesthesiologist: Trevor Iha, MD Performed: anesthesiologist   Preanesthetic Checklist Completed: patient identified, site marked, surgical consent, pre-op evaluation, timeout performed, IV checked, risks and benefits discussed and monitors and equipment checked  Epidural Patient position: sitting Prep: site prepped and draped and DuraPrep Patient monitoring: continuous pulse ox and blood pressure Approach: midline Location: L3-L4 Injection technique: LOR air  Needle:  Needle type: Tuohy  Needle gauge: 17 G Needle length: 9 cm and 9 Needle insertion depth: 9 cm Catheter type: closed end flexible Catheter size: 19 Gauge Catheter at skin depth: 15 cm Test dose: negative  Assessment Events: blood not aspirated, injection not painful, no injection resistance, negative IV test and no paresthesia  Additional Notes Patient identified. Risks/Benefits/Options discussed with patient including but not limited to bleeding, infection, nerve damage, paralysis, failed block, incomplete pain control, headache, blood pressure changes, nausea, vomiting, reactions to medication both or allergic, itching and postpartum back pain. Confirmed with bedside nurse the patient's most recent platelet count. Confirmed with patient that they are not currently taking any anticoagulation, have any bleeding history or any family history of bleeding disorders. Patient expressed understanding and wished to proceed. All questions were answered. Sterile technique was used throughout the entire procedure. Please see nursing notes for vital signs. Test dose was given through epidural needle and negative prior to continuing to dose epidural or start infusion. Warning signs of high block given to the patient including shortness of breath, tingling/numbness in hands, complete motor block, or any concerning  symptoms with instructions to call for help. Patient was given instructions on fall risk and not to get out of bed. All questions and concerns addressed with instructions to call with any issues. 2 Attempt (S) . Patient tolerated procedure well.

## 2018-04-18 NOTE — Discharge Summary (Signed)
OB Discharge Summary    Patient Name: Monica KaufmannCrystal Semmel DOB: 05/28/1990 MRN: [id number]  Date of admission: 04/16/2018 Delivering MD: Levie HeritageSTINSON, JACOB J   Date of discharge: 04/20/2018  Admitting diagnosis: EMS RUQ pain Intrauterine pregnancy: [id number]     Secondary diagnosis:  Active Problems:   Indication for care in labor or delivery  Additional problems:  History of Gallstones (since 2015)      Morbid Obesity Elevated Blood Pressures Anemia UTI in Pregnancy  GBS Positive - adequately treated with PCN Delayed post-partum hemorrhage/Post-Incisional Bleed (total 1,77365mL) Non-reassuring FHTs leading to pLTCS     Discharge diagnosis: Term Pregnancy Delivered, Gestational Hypertension and Anemia    Post partum procedures:None Augmentation: AROM, Pitocin and Cytotec Complications: Hemorrhage>106800mL  Hospital course:  Onset of Labor With Unplanned C/S  28 y.o. yo G3P3003 at 5851w6d was admitted in Latent Labor on 04/16/2018. Patient had a labor course significant for augmentation with cytotec, oxytocin, and AROM (Membrane Rupture Time/Date: 3:52 AM ,04/17/2018 ). The patient went for cesarean section due to Non-Reassuring FHR, and delivered a Viable infant,04/17/2018. Details of operation can be found in separate operative note. Patient had an uncomplicated postpartum course.  Her HgB trended from 9.4 to 8.6. She was discharged home with oral iron. She is ambulating,tolerating a regular diet, passing flatus, and urinating well.  Patient is discharged home in stable condition 04/20/18.  Physical exam  Vitals:   04/18/18 2132 04/19/18 0552 04/19/18 2055 04/20/18 0632  BP: 118/63 122/74 121/67 138/83  Pulse: 73 76 100 77  Resp: 18 17  18   Temp: 98.3 F (36.8 C) 98.4 F (36.9 C) 98.6 F (37 C) 97.9 F (36.6 C)  TempSrc: Oral Oral Oral Oral  SpO2: 100% 99% 100% 100%  Weight:      Height:       General: alert, cooperative and no distress Lochia: appropriate Uterine Fundus: firm Incision:  Healing well with no significant drainage, No significant erythema DVT Evaluation: 1+ bilateral pitting edema   Labs: Lab Results  Component Value Date   WBC 14.1 (H) 04/17/2018   HGB 8.6 (L) 04/17/2018   HCT 26.6 (L) 04/17/2018   MCV 91.4 04/17/2018   PLT 185 04/17/2018   CMP Latest Ref Rng & Units 04/16/2018  Glucose 70 - 99 mg/dL 76  BUN 6 - 20 mg/dL 6  Creatinine 7.250.44 - 3.661.00 mg/dL 4.400.55  Sodium 347135 - 425145 mmol/L 135  Potassium 3.5 - 5.1 mmol/L 3.6  Chloride 98 - 111 mmol/L 107  CO2 22 - 32 mmol/L 22  Calcium 8.9 - 10.3 mg/dL 7.8(L)  Total Protein 6.5 - 8.1 g/dL 6.7  Total Bilirubin 0.3 - 1.2 mg/dL 1.0  Alkaline Phos 38 - 126 U/L 145(H)  AST 15 - 41 U/L 20  ALT 0 - 44 U/L 13    Discharge instruction: per After Visit Summary and "Baby and Me Booklet".  After visit meds:  Allergies as of 04/20/2018   No Known Allergies     Medication List    TAKE these medications   ibuprofen 800 MG tablet Commonly known as:  ADVIL,MOTRIN Take 1 tablet (800 mg total) by mouth 3 (three) times daily.   oxycodone 5 MG capsule Commonly known as:  OXY-IR Take 1 capsule (5 mg total) by mouth every 6 (six) hours as needed for up to 5 days.   prenatal multivitamin Tabs tablet Take 1 tablet by mouth daily at 12 noon.   senna-docusate 8.6-50 MG tablet Commonly known as:  Senokot-S Take  2 tablets by mouth daily. Start taking on:  April 21, 2018            Discharge Care Instructions  (From admission, onward)         Start     Ordered   04/20/18 0000  Discharge wound care:    Comments:  Okay to get dressing wet. Remove dressing in 7 days.   04/20/18 0655          Diet: low salt diet  Activity: Advance as tolerated. Pelvic rest for 6 weeks.   Outpatient follow up:4 weeks Follow up Appt:No future appointments. Message sent to schedule RN visit at [facility] Patient to call for postpartum visit at health department.   Postpartum contraception: Progesterone only  pills  Newborn Data: Live born female  Birth Weight: 8 lb 4.5 oz (3755 g) APGAR: 8, 9  Newborn Delivery   Birth date/time:  04/17/2018 06:56:00 Delivery type:  C-Section, Low Transverse Trial of labor:  Yes C-section categorization:  Primary    Baby Feeding: Bottle Disposition:home with mother  04/20/2018 Tamera Stands, DO

## 2018-04-18 NOTE — Progress Notes (Addendum)
Post Partum Day 1  Subjective: no complaints, up ad lib, voiding, tolerating PO and + flatus Patient has already passed stool. Was encouraged to ambulate the hallway. BP overnight 120/60s. Urine output has improved to ~1L over the last 24 hours.  Objective: Blood pressure 127/67, pulse 94, temperature 98 F (36.7 C), resp. rate 18, height 5\' 1"  (1.549 m), weight (!) 136.1 kg, last menstrual period 06/30/2017, SpO2 98 %, unknown if currently breastfeeding.  Physical Exam:  General: alert, cooperative, appears stated age, no distress and morbidly obese Lochia: appropriate Uterine Fundus: firm Incision: healing well, no significant drainage DVT Evaluation: No evidence of DVT seen on physical exam. No significant calf/ankle edema.  Recent Labs    04/17/18 0446 04/17/18 1524  HGB 9.7* 8.6*  HCT 30.0* 26.6*    Assessment/Plan: Plan for discharge tomorrow and Contraception POPs   LOS: 2 days   Dollene ClevelandHannah C Anderson, DO 04/18/2018, 8:17 AM   Attestation of Attending Supervision of Resident: Evaluation and management procedures were performed by the [facility] under my supervision. I was immediately available for direct supervision, assistance and direction throughout this encounter.  I also confirm that I have verified the information documented in the resident's note, and that I have also personally reperformed the pertinent components of the physical exam and all of the medical decision making activities.   Pressure dressing was removed and incisional site/bleeding is stable.   Jen MowELIZABETH Chanique Duca, DO OB Fellow, [facility], [facility] 04/18/2018  3:22 PM

## 2018-04-19 NOTE — Progress Notes (Addendum)
Subjective: Postpartum Day 2: Cesarean Delivery Patient reports tolerating PO and + flatus.  States still having lots of pain getting up. Has two young children at home, and she is worried about going home.   Objective: Vital signs in last 24 hours: Temp:  [98.3 F (36.8 C)-98.4 F (36.9 C)] 98.4 F (36.9 C) (02/07 0552) Pulse Rate:  [73-76] 76 (02/07 0552) Resp:  [17-18] 17 (02/07 0552) BP: (118-122)/(63-74) 122/74 (02/07 0552) SpO2:  [99 %-100 %] 99 % (02/07 0552)  Physical Exam:  General: alert, cooperative and no distress Lochia: appropriate Uterine Fundus: firm Incision: healing well, no significant drainage, no significant erythema DVT Evaluation: No evidence of DVT seen on physical exam. No significant calf/ankle edema.  Recent Labs    04/17/18 0446 04/17/18 1524  HGB 9.7* 8.6*  HCT 30.0* 26.6*    Assessment/Plan: Status post Cesarean section. Doing well postoperatively.  Continue current care. Will discharge home tomorrow.   Standley Brookinglexandra L Kellogg 04/19/2018, 7:38 AM  Attestation: I have seen this patient and agree with the resident's documentation. I have examined them separately, and we have discussed the plan of care. I have updated progress note as necessary.  Cristal DeerLaurel S. Earlene PlaterWallace, DO OB/GYN Fellow

## 2018-04-20 MED ORDER — OXYCODONE HCL 5 MG PO CAPS: 5.0000 mg | ORAL_CAPSULE | Freq: Four times a day (QID) | ORAL | 0 refills | Status: AC | PRN

## 2018-04-20 MED ORDER — SENNOSIDES-DOCUSATE SODIUM 8.6-50 MG PO TABS: 2.0000 | ORAL_TABLET | ORAL | 0 refills | Status: DC

## 2018-04-20 MED ORDER — IBUPROFEN 800 MG PO TABS: 800.0000 mg | ORAL_TABLET | Freq: Three times a day (TID) | ORAL | 0 refills | Status: DC

## 2018-04-20 MED ORDER — FERROUS SULFATE 325 (65 FE) MG PO TABS: 325.0000 mg | ORAL_TABLET | ORAL | 0 refills | Status: DC

## 2018-04-23 ENCOUNTER — Ambulatory Visit: Payer: Medicaid Other

## 2020-04-27 LAB — AMB REFERRAL TO OB-GYN
Drug Screen, Urine: NEGATIVE
Glucose, 1 hour: 110
Urine Culture, OB: NEGATIVE

## 2020-04-27 LAB — OB RESULTS CONSOLE PLATELET COUNT: Platelets: 212

## 2020-04-27 LAB — OB RESULTS CONSOLE GC/CHLAMYDIA
Chlamydia: NEGATIVE
Gonorrhea: NEGATIVE

## 2020-04-27 LAB — OB RESULTS CONSOLE HIV ANTIBODY (ROUTINE TESTING): HIV: NONREACTIVE

## 2020-04-27 LAB — OB RESULTS CONSOLE HGB/HCT, BLOOD
HCT: 34 (ref 29–41)
Hemoglobin: 11

## 2020-04-27 LAB — OB RESULTS CONSOLE RPR: RPR: NONREACTIVE

## 2020-04-27 LAB — OB RESULTS CONSOLE HEPATITIS B SURFACE ANTIGEN: Hepatitis B Surface Ag: NEGATIVE

## 2020-05-13 LAB — AMB REFERRAL TO OB-GYN: Pap: NEGATIVE

## 2020-09-11 ENCOUNTER — Inpatient Hospital Stay (HOSPITAL_COMMUNITY)
Admission: EM | Admit: 2020-09-11 | Discharge: 2020-09-11 | Disposition: A | Discharge status: Home / Self Care | Payer: Medicaid Other | Attending: Obstetrics & Gynecology | Admitting: Obstetrics & Gynecology

## 2020-09-11 ENCOUNTER — Other Ambulatory Visit: Payer: Self-pay

## 2020-09-11 ENCOUNTER — Encounter (HOSPITAL_COMMUNITY): Payer: Self-pay | Admitting: Emergency Medicine

## 2020-09-11 DIAGNOSIS — O99213 Obesity complicating pregnancy, third trimester: Secondary | ICD-10-CM | POA: Diagnosis not present

## 2020-09-11 DIAGNOSIS — N9489 Other specified conditions associated with female genital organs and menstrual cycle: Secondary | ICD-10-CM

## 2020-09-11 DIAGNOSIS — R102 Pelvic and perineal pain: Secondary | ICD-10-CM | POA: Insufficient documentation

## 2020-09-11 DIAGNOSIS — Z3A32 32 weeks gestation of pregnancy: Secondary | ICD-10-CM

## 2020-09-11 DIAGNOSIS — Z20822 Contact with and (suspected) exposure to covid-19: Secondary | ICD-10-CM | POA: Diagnosis not present

## 2020-09-11 DIAGNOSIS — O26893 Other specified pregnancy related conditions, third trimester: Secondary | ICD-10-CM | POA: Diagnosis present

## 2020-09-11 DIAGNOSIS — Z87891 Personal history of nicotine dependence: Secondary | ICD-10-CM | POA: Insufficient documentation

## 2020-09-11 LAB — CBC
HCT: 30.3 % — ABNORMAL LOW (ref 36.0–46.0)
Hemoglobin: 9.9 g/dL — ABNORMAL LOW (ref 12.0–15.0)
MCH: 31.3 pg (ref 26.0–34.0)
MCHC: 32.7 g/dL (ref 30.0–36.0)
MCV: 95.9 fL (ref 80.0–100.0)
Platelets: 219 10*3/uL (ref 150–400)
RBC: 3.16 MIL/uL — ABNORMAL LOW (ref 3.87–5.11)
RDW: 13.9 % (ref 11.5–15.5)
WBC: 11.6 10*3/uL — ABNORMAL HIGH (ref 4.0–10.5)
nRBC: 0 % (ref 0.0–0.2)

## 2020-09-11 LAB — LIPASE, BLOOD: Lipase: 30 U/L (ref 11–51)

## 2020-09-11 LAB — COMPREHENSIVE METABOLIC PANEL
ALT: 9 U/L (ref 0–44)
AST: 13 U/L — ABNORMAL LOW (ref 15–41)
Albumin: 2.1 g/dL — ABNORMAL LOW (ref 3.5–5.0)
Alkaline Phosphatase: 121 U/L (ref 38–126)
Anion gap: 8 (ref 5–15)
BUN: 8 mg/dL (ref 6–20)
CO2: 22 mmol/L (ref 22–32)
Calcium: 8.3 mg/dL — ABNORMAL LOW (ref 8.9–10.3)
Chloride: 108 mmol/L (ref 98–111)
Creatinine, Ser: 0.49 mg/dL (ref 0.44–1.00)
GFR, Estimated: >60 mL/min (ref >60)
Glucose, Bld: 91 mg/dL (ref 70–99)
Potassium: 3.9 mmol/L (ref 3.5–5.1)
Sodium: 138 mmol/L (ref 135–145)
Total Bilirubin: 0.8 mg/dL (ref 0.3–1.2)
Total Protein: 5.9 g/dL — ABNORMAL LOW (ref 6.5–8.1)

## 2020-09-11 LAB — AMYLASE: Amylase: 86 U/L (ref 28–100)

## 2020-09-11 LAB — RESP PANEL BY RT-PCR (FLU A&B, COVID) ARPGX2
Influenza A by PCR: NEGATIVE
Influenza B by PCR: NEGATIVE
SARS Coronavirus 2 by RT PCR: NEGATIVE

## 2020-09-11 MED ORDER — CYCLOBENZAPRINE HCL 5 MG PO TABS
10.0000 mg | ORAL_TABLET | Freq: Once | ORAL | Status: AC
  Administered 2020-09-11: 10 mg via ORAL
  Filled 2020-09-11: qty 2

## 2020-09-11 MED ORDER — CYCLOBENZAPRINE HCL 10 MG PO TABS: 10.0000 mg | ORAL_TABLET | Freq: Two times a day (BID) | ORAL | 0 refills | Status: DC | PRN

## 2020-09-11 NOTE — MAU Provider Note (Signed)
History     CSN: [id number]  Arrival date and time: 09/11/20 9417   Event Date/Time   First Provider Initiated Contact with Patient 09/11/20 1150      Chief Complaint  Patient presents with   Abdominal Pain   Cough   HPI Monica Jimenez is a 30 y.o. G4P3003 at [redacted]w[redacted]d who presents with abdominal pain. She states the pain is on the right middle and upper side of her abdomen. She states the pain woke her from her sleep today and she rates it a 6/10. She did not take anything for the pain. She denies any bleeding or leaking. Reports normal fetal movement.   She states she has been getting care at the [facility] department until 5/18 when she moved to [place]. She reports she had a telephone intake call with [facility].  OB History     Gravida  4   Para  3   Term  3   Preterm  0   AB  0   Living  3      SAB  0   IAB  0   Ectopic  0   Multiple  0   Live Births  3           Past Medical History:  Diagnosis Date   Anemia    Gallstones    Hemorrhoids    HPV in female 2014   Obesity, morbid, BMI 50 or higher (HCC)    Trichomoniasis    Vaginal Pap smear, abnormal     Past Surgical History:  Procedure Laterality Date   CESAREAN SECTION N/A 04/17/2018   Procedure: CESAREAN SECTION;  Surgeon: Levie Heritage, DO;  Location: [facility];  Service: Obstetrics;  Laterality: N/A;   EXCISION VAGINAL CYST      Family History  Problem Relation Age of Onset   Hypertension Mother    Stroke Mother    Stroke Father    Diabetes Maternal Grandmother    Alcohol abuse Neg Hx    Arthritis Neg Hx    Asthma Neg Hx    Birth defects Neg Hx    Cancer Neg Hx    COPD Neg Hx    Depression Neg Hx    Drug abuse Neg Hx    Early death Neg Hx    Hearing loss Neg Hx    Heart disease Neg Hx    Hyperlipidemia Neg Hx    Kidney disease Neg Hx    Learning disabilities Neg Hx    Mental illness Neg Hx    Mental retardation Neg Hx    Miscarriages / Stillbirths Neg Hx     Vision loss Neg Hx    Varicose Veins Neg Hx     Social History   Tobacco Use   Smoking status: Former    Pack years: 0.00   Smokeless tobacco: Never   Tobacco comments:    marijuana, last 2 yrs ago  Substance Use Topics   Alcohol use: No    Comment: Ocassionaly   Drug use: Not Currently    Types: Marijuana    Allergies: No Known Allergies  Medications Prior to Admission  Medication Sig Dispense Refill Last Dose   ferrous sulfate 325 (65 FE) MG tablet Take 1 tablet (325 mg total) by mouth every other day. 30 tablet 0    ibuprofen (ADVIL,MOTRIN) 800 MG tablet Take 1 tablet (800 mg total) by mouth 3 (three) times daily. 30 tablet 0    Prenatal Vit-Fe  Fumarate-FA (PRENATAL MULTIVITAMIN) TABS tablet Take 1 tablet by mouth daily at 12 noon.      senna-docusate (SENOKOT-S) 8.6-50 MG tablet Take 2 tablets by mouth daily. 10 tablet 0     Review of Systems  Constitutional: Negative.  Negative for fatigue and fever.  HENT: Negative.    Respiratory: Negative.  Negative for shortness of breath.   Cardiovascular: Negative.  Negative for chest pain.  Gastrointestinal:  Positive for abdominal pain. Negative for constipation, diarrhea, nausea and vomiting.  Genitourinary: Negative.  Negative for dysuria, vaginal bleeding and vaginal discharge.  Neurological: Negative.  Negative for dizziness and headaches.  Physical Exam   Blood pressure 122/74, pulse 99, temperature 98.4 F (36.9 C), temperature source Oral, resp. rate 18, SpO2 95 %, unknown if currently breastfeeding.  Physical Exam Vitals and nursing note reviewed.  Constitutional:      General: She is not in acute distress.    Appearance: She is well-developed.  HENT:     Head: Normocephalic.  Eyes:     Pupils: Pupils are equal, round, and reactive to light.  Cardiovascular:     Rate and Rhythm: Normal rate and regular rhythm.     Heart sounds: Normal heart sounds.  Pulmonary:     Effort: Pulmonary effort is normal. No  respiratory distress.     Breath sounds: Normal breath sounds.  Abdominal:     General: Bowel sounds are normal. There is no distension.     Palpations: Abdomen is soft.     Tenderness: There is no abdominal tenderness. There is no guarding or rebound.  Skin:    General: Skin is warm and dry.  Neurological:     Mental Status: She is alert and oriented to person, place, and time.  Psychiatric:        Mood and Affect: Mood normal.        Behavior: Behavior normal.        Thought Content: Thought content normal.        Judgment: Judgment normal.    Fetal Tracing:  Baseline: 135 Variability: moderate Accels: 15x15 Decels: none  Toco: none   MAU Course  Procedures Results for orders placed or performed during the hospital encounter of 09/11/20 (from the past 24 hour(s))  Resp Panel by RT-PCR (Flu A&B, Covid) Nasopharyngeal Swab     Status: None   Collection Time: 09/11/20 12:05 PM   Specimen: Nasopharyngeal Swab; Nasopharyngeal(NP) swabs in vial transport medium  Result Value Ref Range   SARS Coronavirus 2 by RT PCR NEGATIVE NEGATIVE   Influenza A by PCR NEGATIVE NEGATIVE   Influenza B by PCR NEGATIVE NEGATIVE  Comprehensive metabolic panel     Status: Abnormal   Collection Time: 09/11/20 12:06 PM  Result Value Ref Range   Sodium 138 135 - 145 mmol/L   Potassium 3.9 3.5 - 5.1 mmol/L   Chloride 108 98 - 111 mmol/L   CO2 22 22 - 32 mmol/L   Glucose, Bld 91 70 - 99 mg/dL   BUN 8 6 - 20 mg/dL   Creatinine, Ser 2.77 0.44 - 1.00 mg/dL   Calcium 8.3 (L) 8.9 - 10.3 mg/dL   Total Protein 5.9 (L) 6.5 - 8.1 g/dL   Albumin 2.1 (L) 3.5 - 5.0 g/dL   AST 13 (L) 15 - 41 U/L   ALT 9 0 - 44 U/L   Alkaline Phosphatase 121 38 - 126 U/L   Total Bilirubin 0.8 0.3 - 1.2 mg/dL   GFR, Estimated >  60 >60 mL/min   Anion gap 8 5 - 15  CBC     Status: Abnormal   Collection Time: 09/11/20 12:06 PM  Result Value Ref Range   WBC 11.6 (H) 4.0 - 10.5 K/uL   RBC 3.16 (L) 3.87 - 5.11 MIL/uL    Hemoglobin 9.9 (L) 12.0 - 15.0 g/dL   HCT 95.6 (L) 21.3 - 08.6 %   MCV 95.9 80.0 - 100.0 fL   MCH 31.3 26.0 - 34.0 pg   MCHC 32.7 30.0 - 36.0 g/dL   RDW 57.8 46.9 - 62.9 %   Platelets 219 150 - 400 K/uL   nRBC 0.0 0.0 - 0.2 %  Amylase     Status: None   Collection Time: 09/11/20 12:06 PM  Result Value Ref Range   Amylase 86 28 - 100 U/L  Lipase, blood     Status: None   Collection Time: 09/11/20 12:06 PM  Result Value Ref Range   Lipase 30 11 - 51 U/L    MDM UA CBC, CMP, Amylase, Lipase Flexeril PO COVID test  Upon reassessment by RN and later by CNM, patient sleeping soundly and needed to be woken each time. Reports pain is improved. Reassurance provided of normalcy of lab findings and resolution of pain. Patient declines cervical exam.   Assessment and Plan   1. Pain of round ligament affecting pregnancy, antepartum   2. [redacted] weeks gestation of pregnancy    -Discharge home in stable condition -Rx for flexeril sent to patient's pharmacy -Preterm labor precautions discussed -Patient advised to follow-up with OB as scheduled for prenatal care -Patient may return to MAU as needed or if her condition were to change or worsen  Rolm Bookbinder CNM 09/11/2020, 11:50 AM

## 2020-09-11 NOTE — ED Provider Notes (Signed)
Emergency Medicine Provider OB Triage Evaluation Note  Monica Jimenez is a 30 y.o. female, G3O7564, at [redacted]w[redacted]d gestation who presents to the emergency department with complaints of URI symptoms, Braxton Hicks contractions, and concern for loss of mucous plug.  G5 P3-0-1-3.  Patient reports that she has been receiving prenatal care during this pregnancy however has not seen a provider in 1 month due to moving.  Patient reports that she was told that she has high blood pressure during this pregnancy but is not on any medication for currently.  Patient reports that she has been having productive cough, rhinorrhea, and nasal congestion over the last week.  Cough is producing yellow mucus.  Patient denies any fevers or chills.  States that her daughter has had a cold recently.  Patient also reports that she has been having Braxton Hicks contractions recently.  Patient also also concerned that she lost her mucous plug.    Review of  Systems  Positive: Nausea, vomiting, productive cough, rhinorrhea, nasal congestion Negative: Vaginal bleeding, vaginal pain, fever, chills,  Physical Exam  BP 132/88   Pulse 87   Temp 98.4 F (36.9 C) (Oral)   Resp 17   SpO2 99%  General: Awake, no distress  HEENT: Atraumatic  Resp: Normal effort  Cardiac: Normal rate Abd: Nondistended, tenderness to right upper quadrant MSK: Moves all extremities without difficulty Neuro: Speech clear  Medical Decision Making  Pt evaluated for pregnancy concern and is stable for transfer to MAU. Pt is in agreement with plan for transfer.  Discussed with MAU APP who accepts patient in transfer.  Clinical Impression   1. Pain of round ligament affecting pregnancy, antepartum   2. [redacted] weeks gestation of pregnancy        Berneice Heinrich 09/11/20 1614    Jacalyn Lefevre, MD 09/12/20 [phone]

## 2020-09-11 NOTE — ED Notes (Signed)
Pt seen by PA at triage for MSE.  Report called to [facility] in MAU.  Transport notified.

## 2020-09-11 NOTE — ED Triage Notes (Signed)
Pt reports productive cough x 1 week with yellow phlegm.  States she is [redacted] weeks pregnant.

## 2020-09-11 NOTE — Discharge Instructions (Signed)

## 2020-09-11 NOTE — MAU Note (Signed)
Pt reports to mau with c/o right sided pain that is constant and started on Wednesday.  Pt also reports productive cough and SOB for the past week. Denies LOF or vag bleeding. +FM

## 2020-09-16 LAB — AMB REFERRAL TO OB-GYN: Glucose, 1 hour: 113

## 2020-09-28 ENCOUNTER — Encounter: Payer: Self-pay | Admitting: *Deleted

## 2020-09-28 ENCOUNTER — Other Ambulatory Visit: Payer: Self-pay | Admitting: Family

## 2020-09-28 DIAGNOSIS — Z8489 Family history of other specified conditions: Secondary | ICD-10-CM

## 2020-09-28 DIAGNOSIS — Z363 Encounter for antenatal screening for malformations: Secondary | ICD-10-CM

## 2020-09-28 DIAGNOSIS — Z8759 Personal history of other complications of pregnancy, childbirth and the puerperium: Secondary | ICD-10-CM

## 2020-09-29 ENCOUNTER — Ambulatory Visit (INDEPENDENT_AMBULATORY_CARE_PROVIDER_SITE_OTHER): Payer: Medicaid Other | Admitting: Obstetrics and Gynecology

## 2020-09-29 ENCOUNTER — Encounter: Payer: Self-pay | Admitting: Obstetrics and Gynecology

## 2020-09-29 ENCOUNTER — Other Ambulatory Visit: Payer: Self-pay

## 2020-09-29 DIAGNOSIS — Z3A35 35 weeks gestation of pregnancy: Secondary | ICD-10-CM

## 2020-09-29 DIAGNOSIS — O0990 Supervision of high risk pregnancy, unspecified, unspecified trimester: Secondary | ICD-10-CM

## 2020-09-29 DIAGNOSIS — Z6841 Body Mass Index (BMI) 40.0 and over, adult: Secondary | ICD-10-CM | POA: Insufficient documentation

## 2020-09-29 DIAGNOSIS — Z98891 History of uterine scar from previous surgery: Secondary | ICD-10-CM

## 2020-09-29 NOTE — Progress Notes (Signed)
Patient stated that "lump" on abdomen causes her pain. She said she noticed it about 3 weeks ago

## 2020-09-29 NOTE — Progress Notes (Signed)
PRENATAL VISIT NOTE  Subjective:  Monica Jimenez is a 30 y.o. [phone] at [redacted]w[redacted]d being seen today for ongoing prenatal care.  She is currently monitored for the following issues for this high-risk pregnancy and has Palpitations; Uterine contractions during pregnancy; Indication for care in labor or delivery; Supervision of high risk pregnancy, antepartum; [redacted] weeks gestation of pregnancy; History of cesarean section; and BMI 50.0-59.9, adult (HCC) on their problem list.  Patient doing well with no acute concerns today. She reports  no headache RUQ pain or visual changes .  Contractions: Irregular. Vag. Bleeding: None.  Movement: Present. Denies leaking of fluid.   The patient is a late transfer of care from [facility] for possible CHTN versus gestational hypertension.  Pt is not symptomatic today and has normal blood pressure.  Discussed VBAC versus repeat c/s  29 y.o. [id number] at [redacted]w[redacted]d with Estimated Date of Delivery: 11/02/20 was seen today in office to discuss trial of labor after cesarean section (TOLAC) versus elective repeat cesarean delivery (ERCD). The following risks were discussed with the patient.  Risk of uterine rupture at term is 0.78 percent with TOLAC and 0.22 percent with ERCD. 1 in 10 uterine ruptures will result in neonatal death or neurological injury. The benefits of a trial of labor after cesarean (TOLAC) resulting in a vaginal birth after cesarean (VBAC) include the following: shorter length of hospital stay and postpartum recovery (in most cases); fewer complications, such as postpartum fever, wound or uterine infection, thromboembolism (blood clots in the leg or lung), need for blood transfusion and fewer neonatal breathing problems. The risks of an attempted VBAC or TOLAC include the following: Risk of failed trial of labor after cesarean (TOLAC) without a vaginal birth after cesarean (VBAC) resulting in repeat cesarean delivery (RCD) in about 20 to 40 percent of women who  attempt VBAC.  Risk of rupture of uterus resulting in an emergency cesarean delivery. The risk of uterine rupture may be related in part to the type of uterine incision made during the first cesarean delivery. A previous transverse uterine incision has the lowest risk of rupture (0.2 to 1.5 percent risk). Vertical or T-shaped uterine incisions have a higher risk of uterine rupture (4 to 9 percent risk)The risk of fetal death is very low with both VBAC and elective repeat cesarean delivery (ERCD), but the likelihood of fetal death is higher with VBAC than with ERCD. Maternal death is very rare with either type of delivery. The risks of an elective repeat cesarean delivery (ERCD) were reviewed with the patient including but not limited to: 04/998 risk of uterine rupture which could have serious consequences, bleeding which may require transfusion; infection which may require antibiotics; injury to bowel, bladder or other surrounding organs (bowel, bladder, ureters); injury to the fetus; need for additional procedures including hysterectomy in the event of a life-threatening hemorrhage; thromboembolic phenomenon; abnormal placentation; incisional problems; death and other postoperative or anesthesia complications.    These risks and benefits are summarized on the consent form, which was reviewed with the patient during the visit.  All her questions answered and she signed a consent indicating a preference for TOLAC/ERCD. A copy of the consent was given to the patient.   (Patient also desires bilateral tubal sterilization (BTS) at the time of RCD, also discussed the additional risks of regret, failure rate of 0.5-1% with increased risk of ectopic gestation and permanent and irreversible nature of the procedure.  Also discussed possibility of post-tubal pain syndrome.  Other forms of  reversible BCM discussed, also discussed vasectomy, patient desires BTS).     The following portions of the patient's history  were reviewed and updated as appropriate: allergies, current medications, past family history, past medical history, past social history, past surgical history and problem list. Problem list updated.  Objective:   Vitals:   09/29/20 1028  BP: 119/79  Pulse: 92  Weight: 283 lb (128.4 kg)    Fetal Status: Fetal Heart Rate (bpm): 145 Fundal Height: 36 cm Movement: Present     General:  Alert, oriented and cooperative. Patient is in no acute distress.  Skin: Skin is warm and dry. No rash noted.   Cardiovascular: Normal heart rate noted  Respiratory: Normal respiratory effort, no problems with respiration noted  Abdomen: Soft, gravid, appropriate for gestational age.  Pain/Pressure: Present     Pelvic: Cervical exam deferred        Extremities: Normal range of motion.  Edema: Trace  Mental Status:  Normal mood and affect. Normal behavior. Normal judgment and thought content.  Pt has edematous area in lower pannus which she says causes her pain.  No erythema or superficial skin infection noted, peu de l'orange skin changes noted, low risk for hernia Assessment and Plan:  Pregnancy: G4P3003 at [redacted]w[redacted]d  1. Supervision of high risk pregnancy, antepartum Continue routine care, GC/C next visit  2. [redacted] weeks gestation of pregnancy   3. History of cesarean section Pt desires repeat c section with tubal , consents signed, will schedule for 39 weeks pending change in blood pressure  4. BMI 50.0-59.9, adult (HCC)   Preterm labor symptoms and general obstetric precautions including but not limited to vaginal bleeding, contractions, leaking of fluid and fetal movement were reviewed in detail with the patient.  Please refer to After Visit Summary for other counseling recommendations.   Return in about 1 week (around 10/06/2020) for [facility], in person, 36 weeks swabs.   Mariel Aloe, MD Faculty Attending [facility]

## 2020-10-06 ENCOUNTER — Other Ambulatory Visit: Payer: Self-pay | Admitting: Family

## 2020-10-06 ENCOUNTER — Encounter: Payer: Self-pay | Admitting: *Deleted

## 2020-10-06 ENCOUNTER — Other Ambulatory Visit: Payer: Self-pay

## 2020-10-06 ENCOUNTER — Other Ambulatory Visit: Payer: Self-pay | Admitting: *Deleted

## 2020-10-06 ENCOUNTER — Ambulatory Visit: Payer: Medicaid Other | Attending: Obstetrics & Gynecology

## 2020-10-06 ENCOUNTER — Ambulatory Visit: Payer: Medicaid Other | Admitting: *Deleted

## 2020-10-06 VITALS — BP 109/49 | HR 80

## 2020-10-06 DIAGNOSIS — Z363 Encounter for antenatal screening for malformations: Secondary | ICD-10-CM

## 2020-10-06 DIAGNOSIS — O0990 Supervision of high risk pregnancy, unspecified, unspecified trimester: Secondary | ICD-10-CM | POA: Diagnosis present

## 2020-10-06 DIAGNOSIS — Z8489 Family history of other specified conditions: Secondary | ICD-10-CM

## 2020-10-06 DIAGNOSIS — Z8759 Personal history of other complications of pregnancy, childbirth and the puerperium: Secondary | ICD-10-CM

## 2020-10-06 DIAGNOSIS — Z6841 Body Mass Index (BMI) 40.0 and over, adult: Secondary | ICD-10-CM

## 2020-10-11 ENCOUNTER — Ambulatory Visit (INDEPENDENT_AMBULATORY_CARE_PROVIDER_SITE_OTHER): Payer: Medicaid Other | Admitting: Family Medicine

## 2020-10-11 ENCOUNTER — Other Ambulatory Visit (HOSPITAL_COMMUNITY)
Admission: RE | Admit: 2020-10-11 | Discharge: 2020-10-11 | Disposition: A | Discharge status: Home / Self Care | Payer: Medicaid Other | Source: Ambulatory Visit | Attending: Family Medicine | Admitting: Family Medicine

## 2020-10-11 ENCOUNTER — Other Ambulatory Visit: Payer: Self-pay

## 2020-10-11 VITALS — BP 101/72 | HR 86 | Wt 282.0 lb

## 2020-10-11 DIAGNOSIS — Z98891 History of uterine scar from previous surgery: Secondary | ICD-10-CM

## 2020-10-11 DIAGNOSIS — O0990 Supervision of high risk pregnancy, unspecified, unspecified trimester: Secondary | ICD-10-CM | POA: Insufficient documentation

## 2020-10-11 DIAGNOSIS — Z3A36 36 weeks gestation of pregnancy: Secondary | ICD-10-CM

## 2020-10-11 NOTE — Patient Instructions (Signed)

## 2020-10-11 NOTE — Progress Notes (Signed)
   PRENATAL VISIT NOTE  Subjective:  Monica Jimenez is a 30 y.o. [phone] at 101w6d being seen today for ongoing prenatal care.  She is currently monitored for the following issues for this high-risk pregnancy and has Palpitations; Uterine contractions during pregnancy; Supervision of high risk pregnancy, antepartum; History of cesarean section; and BMI 50.0-59.9, adult (HCC) on their problem list.  Patient reports no complaints.  Contractions: Irritability. Vag. Bleeding: None.  Movement: Present. Denies leaking of fluid.   The following portions of the patient's history were reviewed and updated as appropriate: allergies, current medications, past family history, past medical history, past social history, past surgical history and problem list.   Objective:   Vitals:   10/11/20 0938  BP: 101/72  Pulse: 86  Weight: 282 lb (127.9 kg)    Fetal Status: Fetal Heart Rate (bpm): 156 Fundal Height: 41 cm Movement: Present     General:  Alert, oriented and cooperative. Patient is in no acute distress.  Skin: Skin is warm and dry. No rash noted.   Cardiovascular: Normal heart rate noted  Respiratory: Normal respiratory effort, no problems with respiration noted  Abdomen: Soft, gravid, appropriate for gestational age.  Pain/Pressure: Present     Pelvic: Cervical exam deferred        Extremities: Normal range of motion.  Edema: Trace  Mental Status: Normal mood and affect. Normal behavior. Normal judgment and thought content.   Assessment and Plan:  Pregnancy: G4P3003 at [redacted]w[redacted]d 1. Supervision of high risk pregnancy, antepartum Cultures today IN AP testing for BMI--next u/s is 8/4. Last one shows normal growth - GC/Chlamydia probe amp ([place])not at [facility] - Culture, beta strep (group b only)  2. History of cesarean section Desires ERLTCS and BTL.--scheduled.  Preterm labor symptoms and general obstetric precautions including but not limited to vaginal bleeding, contractions, leaking  of fluid and fetal movement were reviewed in detail with the patient. Please refer to After Visit Summary for other counseling recommendations.   Return in 1 week (on 10/18/2020).  Future Appointments  Date Time Provider Department Center  10/14/2020 10:30 AM [facility] NURSE [facility]  10/14/2020 10:45 AM [facility] US4 [facility]  10/20/2020  2:15 PM [facility] NST [facility]  10/20/2020  3:15 PM Venora Maples, MD [facility]  10/21/2020  9:15 AM [facility] NURSE [facility]  10/27/2020 11:00 AM [facility] NURSE [facility]  10/28/2020  8:15 AM [facility] NST [facility]    Reva Bores, MD

## 2020-10-12 LAB — GC/CHLAMYDIA PROBE AMP (~~LOC~~) NOT AT ARMC
Chlamydia: NEGATIVE
Comment: NEGATIVE
Comment: NORMAL
Neisseria Gonorrhea: NEGATIVE

## 2020-10-13 ENCOUNTER — Other Ambulatory Visit: Payer: Medicaid Other

## 2020-10-14 ENCOUNTER — Other Ambulatory Visit: Payer: Self-pay

## 2020-10-14 ENCOUNTER — Ambulatory Visit: Payer: Medicaid Other | Admitting: *Deleted

## 2020-10-14 ENCOUNTER — Ambulatory Visit: Payer: Medicaid Other | Attending: Obstetrics and Gynecology

## 2020-10-14 ENCOUNTER — Encounter: Payer: Self-pay | Admitting: *Deleted

## 2020-10-14 VITALS — BP 113/51 | HR 92

## 2020-10-14 DIAGNOSIS — Z362 Encounter for other antenatal screening follow-up: Secondary | ICD-10-CM | POA: Diagnosis not present

## 2020-10-14 DIAGNOSIS — O163 Unspecified maternal hypertension, third trimester: Secondary | ICD-10-CM | POA: Diagnosis not present

## 2020-10-14 DIAGNOSIS — Z3A37 37 weeks gestation of pregnancy: Secondary | ICD-10-CM

## 2020-10-14 DIAGNOSIS — O99213 Obesity complicating pregnancy, third trimester: Secondary | ICD-10-CM | POA: Diagnosis not present

## 2020-10-14 DIAGNOSIS — O34219 Maternal care for unspecified type scar from previous cesarean delivery: Secondary | ICD-10-CM | POA: Diagnosis not present

## 2020-10-14 DIAGNOSIS — Z6841 Body Mass Index (BMI) 40.0 and over, adult: Secondary | ICD-10-CM

## 2020-10-14 DIAGNOSIS — O0990 Supervision of high risk pregnancy, unspecified, unspecified trimester: Secondary | ICD-10-CM

## 2020-10-14 DIAGNOSIS — O10913 Unspecified pre-existing hypertension complicating pregnancy, third trimester: Secondary | ICD-10-CM | POA: Diagnosis not present

## 2020-10-14 DIAGNOSIS — Z8489 Family history of other specified conditions: Secondary | ICD-10-CM

## 2020-10-14 LAB — CULTURE, BETA STREP (GROUP B ONLY): Strep Gp B Culture: POSITIVE — AB

## 2020-10-15 ENCOUNTER — Other Ambulatory Visit: Payer: Self-pay

## 2020-10-15 ENCOUNTER — Encounter (HOSPITAL_COMMUNITY): Payer: Self-pay | Admitting: Obstetrics & Gynecology

## 2020-10-15 ENCOUNTER — Inpatient Hospital Stay (HOSPITAL_COMMUNITY)
Admission: AD | Admit: 2020-10-15 | Discharge: 2020-10-15 | Disposition: A | Discharge status: Home / Self Care | Payer: Medicaid Other | Attending: Obstetrics & Gynecology | Admitting: Obstetrics & Gynecology

## 2020-10-15 ENCOUNTER — Telehealth (HOSPITAL_COMMUNITY): Payer: Self-pay | Admitting: *Deleted

## 2020-10-15 DIAGNOSIS — O0990 Supervision of high risk pregnancy, unspecified, unspecified trimester: Secondary | ICD-10-CM

## 2020-10-15 DIAGNOSIS — Z87891 Personal history of nicotine dependence: Secondary | ICD-10-CM | POA: Diagnosis not present

## 2020-10-15 DIAGNOSIS — O1203 Gestational edema, third trimester: Secondary | ICD-10-CM | POA: Diagnosis not present

## 2020-10-15 DIAGNOSIS — R1030 Lower abdominal pain, unspecified: Secondary | ICD-10-CM | POA: Diagnosis not present

## 2020-10-15 DIAGNOSIS — R600 Localized edema: Secondary | ICD-10-CM

## 2020-10-15 DIAGNOSIS — O26893 Other specified pregnancy related conditions, third trimester: Secondary | ICD-10-CM | POA: Insufficient documentation

## 2020-10-15 DIAGNOSIS — O99891 Other specified diseases and conditions complicating pregnancy: Secondary | ICD-10-CM

## 2020-10-15 DIAGNOSIS — Z3A37 37 weeks gestation of pregnancy: Secondary | ICD-10-CM | POA: Diagnosis not present

## 2020-10-15 DIAGNOSIS — E65 Localized adiposity: Secondary | ICD-10-CM

## 2020-10-15 LAB — URINALYSIS, ROUTINE W REFLEX MICROSCOPIC
Bilirubin Urine: NEGATIVE
Glucose, UA: NEGATIVE mg/dL
Hgb urine dipstick: NEGATIVE
Ketones, ur: NEGATIVE mg/dL
Leukocytes,Ua: NEGATIVE
Nitrite: NEGATIVE
Protein, ur: 30 mg/dL — AB
Specific Gravity, Urine: 1.032 — ABNORMAL HIGH (ref 1.005–1.030)
pH: 5 (ref 5.0–8.0)

## 2020-10-15 MED ORDER — LIDOCAINE 5 % EX PTCH
1.0000 | MEDICATED_PATCH | Freq: Once | CUTANEOUS | Status: DC
  Administered 2020-10-15: 1 via TRANSDERMAL
  Filled 2020-10-15: qty 1

## 2020-10-15 MED ORDER — TRAMADOL HCL 50 MG PO TABS: 50.0000 mg | ORAL_TABLET | Freq: Four times a day (QID) | ORAL | 0 refills | Status: DC | PRN

## 2020-10-15 MED ORDER — TRAMADOL HCL 50 MG PO TABS
50.0000 mg | ORAL_TABLET | Freq: Once | ORAL | Status: AC
  Administered 2020-10-15: 50 mg via ORAL
  Filled 2020-10-15: qty 1

## 2020-10-15 MED ORDER — LIDOCAINE 5 % EX PTCH: 1.0000 | MEDICATED_PATCH | Freq: Every day | CUTANEOUS | 0 refills | Status: DC | PRN

## 2020-10-15 NOTE — ED Notes (Signed)
Report given to MAU RN .  

## 2020-10-15 NOTE — ED Triage Notes (Addendum)
Patient is [redacted] weeks pregnant G4P3 , reports LLQ abdominal pain this week , no emesis or diarrhea , denies fever or chills , no vaginal bleeding . Scheduled for C- section on Aug.19,2022.

## 2020-10-15 NOTE — MAU Provider Note (Signed)
Chief Complaint:  [redacted] weeks Pregnant / Abdominal Pain  and Abdominal Pain   Event Date/Time   First Provider Initiated Contact with Patient 10/15/20 0415     HPI: Monica Jimenez is a 30 y.o. G4P3003 at 62w3dwho presents to maternity admissions reporting swelling in skin of lower abdomen (pannus) which is so painful it woke her from sleep. Has had it for 2-3 wks but "doctors don't do anything about it" . She reports good fetal movement, denies LOF, vaginal bleeding, vaginal itching/burning, urinary symptoms, h/a, dizziness, n/v, diarrhea, constipation or fever/chills.  Abdominal Pain This is a recurrent problem. The current episode started 1 to 4 weeks ago. The onset quality is gradual. The problem occurs constantly. Pain location: skin of pannus. The quality of the pain is burning. The abdominal pain does not radiate. Pertinent negatives include no fever, frequency, headaches or myalgias. The pain is aggravated by palpation. The pain is relieved by Nothing. She has tried nothing for the symptoms.  RN Note: Pt came to [facility] ED with swelling and pain in lower abd. States pain has been present for couple wks and is different from ctxs. Middle of lower abd is tight, swollen and feels like it may "pop". Denies VB or LOF. Good FM  Past Medical History: Past Medical History:  Diagnosis Date   Anemia    Gallstones    Hemorrhoids    HPV in female 2014   Hypertension    Obesity, morbid, BMI 50 or higher (HCC)    Trichomoniasis    Vaginal Pap smear, abnormal     Past obstetric history: OB History  Gravida Para Term Preterm AB Living  4 3 3  0 0 3  SAB IAB Ectopic Multiple Live Births  0 0 0 0 3    # Outcome Date GA Lbr Len/2nd Weight Sex Delivery Anes PTL Lv  4 Current           3 Term 04/17/18 [redacted]w[redacted]d  3755 g F CS-LTranv EPI  LIV  2 Term 07/27/16 [redacted]w[redacted]d 14:17 / 00:09 3300 g M Vag-Spont EPI  LIV     Birth Comments: WNL  1 Term     M Vag-Spont   LIV    Past Surgical History: Past Surgical  History:  Procedure Laterality Date   CESAREAN SECTION N/A 04/17/2018   Procedure: CESAREAN SECTION;  Surgeon: 06/16/2018, DO;  Location: [facility];  Service: Obstetrics;  Laterality: N/A;   EXCISION VAGINAL CYST      Family History: Family History  Problem Relation Age of Onset   Hypertension Mother    Stroke Mother    Stroke Father    Diabetes Maternal Grandmother    Alcohol abuse Neg Hx    Arthritis Neg Hx    Asthma Neg Hx    Birth defects Neg Hx    Cancer Neg Hx    COPD Neg Hx    Depression Neg Hx    Drug abuse Neg Hx    Early death Neg Hx    Hearing loss Neg Hx    Heart disease Neg Hx    Hyperlipidemia Neg Hx    Kidney disease Neg Hx    Learning disabilities Neg Hx    Mental illness Neg Hx    Mental retardation Neg Hx    Miscarriages / Stillbirths Neg Hx    Vision loss Neg Hx    Varicose Veins Neg Hx     Social History: Social History   Tobacco Use  Smoking status: Former   Smokeless tobacco: Never   Tobacco comments:    marijuana, last 2 yrs ago  Vaping Use   Vaping Use: Former   Substances: Flavoring  Substance Use Topics   Alcohol use: No    Comment: Ocassionaly   Drug use: Not Currently    Types: Marijuana    Allergies: No Known Allergies  Meds:  Medications Prior to Admission  Medication Sig Dispense Refill Last Dose   cyclobenzaprine (FLEXERIL) 10 MG tablet Take 1 tablet (10 mg total) by mouth 2 (two) times daily as needed for muscle spasms. (Patient not taking: No sig reported) 20 tablet 0    ferrous sulfate 325 (65 FE) MG tablet Take 1 tablet (325 mg total) by mouth every other day. 30 tablet 0    Prenatal Vit-Fe Fumarate-FA (PRENATAL MULTIVITAMIN) TABS tablet Take 1 tablet by mouth daily at 12 noon. (Patient not taking: No sig reported)      promethazine (PHENERGAN) 25 MG tablet Take by mouth. (Patient not taking: No sig reported)      senna-docusate (SENOKOT-S) 8.6-50 MG tablet Take 2 tablets by mouth daily. (Patient not  taking: No sig reported) 10 tablet 0     I have reviewed patient's Past Medical Hx, Surgical Hx, Family Hx, Social Hx, medications and allergies.   ROS:  Review of Systems  Constitutional:  Negative for fever.  Gastrointestinal:  Positive for abdominal pain.  Genitourinary:  Negative for frequency.  Musculoskeletal:  Negative for myalgias.  Neurological:  Negative for headaches.  Other systems negative  Physical Exam  Patient Vitals for the past 24 hrs:  BP Temp Pulse Resp SpO2 Height  10/15/20 0346 122/73 -- 93 -- -- --  10/15/20 0345 -- 98.4 F (36.9 C) -- 20 -- 5\' 1"  (1.549 m)  10/15/20 0305 125/66 98.2 F (36.8 C) 88 20 97 % --   Constitutional: Well-developed, well-nourished female in no acute distress. Tearful Cardiovascular: normal rate and rhythm Respiratory: normal effort, clear to auscultation bilaterally GI: Abd soft, gravid appropriate for gestational age.   No rebound or guarding.      Center of lower pannus has edema of skin with peau d'orange texture.  No cellulitis MS: Extremities nontender, no edema, normal ROM Neurologic: Alert and oriented x 4.  GU: Neg CVAT.    FHT:  Baseline 140 , moderate variability, accelerations present, no decelerations Contractions: Irregular     Labs: Results for orders placed or performed during the hospital encounter of 10/15/20 (from the past 24 hour(s))  Urinalysis, Routine w reflex microscopic Urine, Clean Catch     Status: Abnormal   Collection Time: 10/15/20  4:18 AM  Result Value Ref Range   Color, Urine AMBER (A) YELLOW   APPearance HAZY (A) CLEAR   Specific Gravity, Urine 1.032 (H) 1.005 - 1.030   pH 5.0 5.0 - 8.0   Glucose, UA NEGATIVE NEGATIVE mg/dL   Hgb urine dipstick NEGATIVE NEGATIVE   Bilirubin Urine NEGATIVE NEGATIVE   Ketones, ur NEGATIVE NEGATIVE mg/dL   Protein, ur 30 (A) NEGATIVE mg/dL   Nitrite NEGATIVE NEGATIVE   Leukocytes,Ua NEGATIVE NEGATIVE   RBC / HPF 0-5 0 - 5 RBC/hpf   WBC, UA 0-5 0 - 5  WBC/hpf   Bacteria, UA RARE (A) NONE SEEN   Squamous Epithelial / LPF 6-10 0 - 5   Mucus PRESENT        Imaging:    MAU Course/MDM: I have ordered labs and reviewed results. UA  is clear. NST reviewed, reactive Consult Dr Charlotta Newton with presentation, exam findings and test results.  Treatments in MAU included Lidoderm patch, Tramadol   These did provide relief.    Assessment: Single IUP at [redacted]w[redacted]d Skin edema of pannus, painful  Plan: Discharge home Rx Tramadol for pain, #15tab Rx Lidoderm patches prn pain Labor precautions and fetal kick counts Follow up in Office for prenatal visits Encouraged to return if she develops worsening of symptoms, increase in pain, fever, or other concerning symptoms.  Pt stable at time of discharge.  Wynelle Bourgeois CNM, MSN Certified Nurse-Midwife 10/15/2020 4:15 AM

## 2020-10-15 NOTE — ED Provider Notes (Signed)
Emergency Medicine Provider OB Triage Evaluation Note  Monica Jimenez is a 29 y.o. female, [id number], at [redacted]w[redacted]d gestation who presents to the emergency department with complaints of abdominal pain.  The patient reports left lower quadrant abdominal pain this week, but reports that she developed suprapubic abdominal pain tonight.  She reports that she does feel as if she is having contractions, but has been frequently having Braxton Hicks contractions and states that these feel similar.  She describes the pain as feeling as if something wants to pop out of her.  She denies having an urge to push, vaginal bleeding, discharge or vaginal fluid, vomiting, fever. Review of  Systems  Positive: Pelvic pain, abdominal pain Negative: Nausea bleeding, vaginal discharge, vomiting, fever  Physical Exam  BP 125/66 (BP Location: Right Arm)   Pulse 88   Temp 98.2 F (36.8 C)   Resp 20   LMP 01/27/2020   SpO2 97%  General: Awake, no distress  HEENT: Atraumatic  Resp: Normal effort  Cardiac: Normal rate Abd: Gravid, palpation of the suprapubic region MSK: Moves all extremities without difficulty Neuro: Speech clear  Medical Decision Making  Pt evaluated for pregnancy concern and is stable for transfer to MAU. Pt is in agreement with plan for transfer.  3:27 AM Discussed with MAU APP, Wynelle Bourgeois, who accepts patient in transfer.  Clinical Impression  No diagnosis found.     Barkley Boards, PA-C 10/15/20 0328    Tilden Fossa, MD 10/19/20 1426

## 2020-10-15 NOTE — Patient Instructions (Signed)
Darius Fillingim  10/15/2020   Your procedure is scheduled on:  10/29/2020  Arrive at 0730 at [employer] [facility] on [address] at [facility]  and [employer]. You are invited to use the FREE valet parking or use the Visitor's parking deck.  Pick up the phone at the desk and dial [phone].  Call this number if you have problems the morning of surgery: [phone]  Remember:   Do not eat food:(After Midnight) Desps de medianoche.  Do not drink clear liquids: (After Midnight) Desps de medianoche.  Take these medicines the morning of surgery with A SIP OF WATER:  none   Do not wear jewelry, make-up or nail polish.  Do not wear lotions, powders, or perfumes. Do not wear deodorant.  Do not shave 48 hours prior to surgery.  Do not bring valuables to the hospital.  [facility] is not   responsible for any belongings or valuables brought to the hospital.  Contacts, dentures or bridgework may not be worn into surgery.  Leave suitcase in the car. After surgery it may be brought to your room.  For patients admitted to the hospital, checkout time is 11:00 AM the day of              discharge.      Please read over the following fact sheets that you were given:     Preparing for Surgery

## 2020-10-15 NOTE — Telephone Encounter (Signed)
Preadmission screen  

## 2020-10-15 NOTE — MAU Note (Signed)
Pt came to [facility] ED with swelling and pain in lower abd. States pain has been present for couple wks and is different from ctxs. Middle of lower abd is tight, swollen and feels like it may "pop". Denies VB or LOF. Good FM

## 2020-10-18 ENCOUNTER — Encounter (HOSPITAL_COMMUNITY): Payer: Self-pay

## 2020-10-20 ENCOUNTER — Encounter: Payer: Medicaid Other | Admitting: Family Medicine

## 2020-10-20 ENCOUNTER — Other Ambulatory Visit: Payer: Medicaid Other

## 2020-10-21 ENCOUNTER — Ambulatory Visit: Payer: Medicaid Other

## 2020-10-21 ENCOUNTER — Other Ambulatory Visit: Payer: Medicaid Other

## 2020-10-25 ENCOUNTER — Other Ambulatory Visit: Payer: Self-pay | Admitting: Obstetrics and Gynecology

## 2020-10-25 DIAGNOSIS — Z98891 History of uterine scar from previous surgery: Secondary | ICD-10-CM | POA: Insufficient documentation

## 2020-10-25 MED ORDER — CEFAZOLIN IN SODIUM CHLORIDE 3-0.9 GM/100ML-% IV SOLN: 3.0000 g | INTRAVENOUS | Status: DC

## 2020-10-25 MED ORDER — SOD CITRATE-CITRIC ACID 500-334 MG/5ML PO SOLN: 30.0000 mL | ORAL | Status: DC

## 2020-10-25 NOTE — Progress Notes (Unsigned)
Pre-op labs entered

## 2020-10-25 NOTE — Progress Notes (Signed)
Preop orders placed on 10/25/20 by Donavan Foil, MD

## 2020-10-25 NOTE — Addendum Note (Signed)
Addended by: Warden Fillers on: 10/25/2020 01:59 PM   Modules accepted: Orders, SmartSet

## 2020-10-27 ENCOUNTER — Encounter
Admission: RE | Admit: 2020-10-27 | Discharge: 2020-10-27 | Disposition: A | Discharge status: Home / Self Care | Payer: Medicaid Other | Source: Ambulatory Visit | Attending: Obstetrics and Gynecology | Admitting: Obstetrics and Gynecology

## 2020-10-27 ENCOUNTER — Other Ambulatory Visit: Payer: Self-pay

## 2020-10-27 ENCOUNTER — Other Ambulatory Visit: Payer: Medicaid Other

## 2020-10-27 ENCOUNTER — Ambulatory Visit: Payer: Medicaid Other

## 2020-10-27 DIAGNOSIS — Z01812 Encounter for preprocedural laboratory examination: Secondary | ICD-10-CM | POA: Diagnosis present

## 2020-10-27 LAB — CBC
HCT: 31.9 % — ABNORMAL LOW (ref 36.0–46.0)
Hemoglobin: 10 g/dL — ABNORMAL LOW (ref 12.0–15.0)
MCH: 29.9 pg (ref 26.0–34.0)
MCHC: 31.3 g/dL (ref 30.0–36.0)
MCV: 95.2 fL (ref 80.0–100.0)
Platelets: 215 10*3/uL (ref 150–400)
RBC: 3.35 MIL/uL — ABNORMAL LOW (ref 3.87–5.11)
RDW: 13.7 % (ref 11.5–15.5)
WBC: 9.1 10*3/uL (ref 4.0–10.5)
nRBC: 0 % (ref 0.0–0.2)

## 2020-10-27 LAB — TYPE AND SCREEN
ABO/RH(D): O POS
Antibody Screen: NEGATIVE

## 2020-10-27 LAB — RPR: RPR Ser Ql: NONREACTIVE

## 2020-10-28 ENCOUNTER — Encounter (HOSPITAL_COMMUNITY): Payer: Self-pay | Admitting: Obstetrics and Gynecology

## 2020-10-28 ENCOUNTER — Other Ambulatory Visit: Payer: Self-pay | Admitting: Obstetrics and Gynecology

## 2020-10-28 ENCOUNTER — Other Ambulatory Visit: Payer: Medicaid Other

## 2020-10-29 ENCOUNTER — Inpatient Hospital Stay (HOSPITAL_COMMUNITY): Payer: Medicaid Other | Admitting: Anesthesiology

## 2020-10-29 ENCOUNTER — Other Ambulatory Visit: Payer: Self-pay

## 2020-10-29 ENCOUNTER — Encounter (HOSPITAL_COMMUNITY)
Admission: RE | Disposition: A | Discharge status: Home / Self Care | Payer: Self-pay | Source: Home / Self Care | Attending: Obstetrics and Gynecology

## 2020-10-29 ENCOUNTER — Encounter (HOSPITAL_COMMUNITY): Payer: Self-pay | Admitting: Obstetrics and Gynecology

## 2020-10-29 ENCOUNTER — Inpatient Hospital Stay (HOSPITAL_COMMUNITY)
Admission: RE | Admit: 2020-10-29 | Discharge: 2020-11-01 | DRG: 784 | Disposition: A | Discharge status: Home / Self Care | Payer: Medicaid Other | Attending: Obstetrics and Gynecology | Admitting: Obstetrics and Gynecology

## 2020-10-29 DIAGNOSIS — O9081 Anemia of the puerperium: Secondary | ICD-10-CM | POA: Diagnosis not present

## 2020-10-29 DIAGNOSIS — O99214 Obesity complicating childbirth: Secondary | ICD-10-CM | POA: Diagnosis present

## 2020-10-29 DIAGNOSIS — Z302 Encounter for sterilization: Secondary | ICD-10-CM

## 2020-10-29 DIAGNOSIS — Z87891 Personal history of nicotine dependence: Secondary | ICD-10-CM | POA: Diagnosis not present

## 2020-10-29 DIAGNOSIS — O0990 Supervision of high risk pregnancy, unspecified, unspecified trimester: Secondary | ICD-10-CM

## 2020-10-29 DIAGNOSIS — O99824 Streptococcus B carrier state complicating childbirth: Secondary | ICD-10-CM | POA: Diagnosis present

## 2020-10-29 DIAGNOSIS — Z3A39 39 weeks gestation of pregnancy: Secondary | ICD-10-CM

## 2020-10-29 DIAGNOSIS — D62 Acute posthemorrhagic anemia: Secondary | ICD-10-CM | POA: Diagnosis not present

## 2020-10-29 DIAGNOSIS — Z6841 Body Mass Index (BMI) 40.0 and over, adult: Secondary | ICD-10-CM

## 2020-10-29 DIAGNOSIS — O34211 Maternal care for low transverse scar from previous cesarean delivery: Secondary | ICD-10-CM

## 2020-10-29 DIAGNOSIS — Z98891 History of uterine scar from previous surgery: Secondary | ICD-10-CM

## 2020-10-29 LAB — SARS CORONAVIRUS 2 (TAT 6-24 HRS): SARS Coronavirus 2: NEGATIVE

## 2020-10-29 SURGERY — Surgical Case
Anesthesia: Epidural | Site: Abdomen | Laterality: Bilateral | Wound class: Clean Contaminated

## 2020-10-29 MED ORDER — NALBUPHINE HCL 10 MG/ML IJ SOLN: 5.0000 mg | Freq: Once | INTRAMUSCULAR | Status: AC | PRN

## 2020-10-29 MED ORDER — COCONUT OIL OIL: 1.0000 "application " | TOPICAL_OIL | Status: DC | PRN

## 2020-10-29 MED ORDER — MORPHINE SULFATE (PF) 0.5 MG/ML IJ SOLN
INTRAMUSCULAR | Status: AC
  Filled 2020-10-29: qty 10

## 2020-10-29 MED ORDER — KETOROLAC TROMETHAMINE 30 MG/ML IJ SOLN: 30.0000 mg | Freq: Four times a day (QID) | INTRAMUSCULAR | Status: AC | PRN

## 2020-10-29 MED ORDER — SCOPOLAMINE 1 MG/3DAYS TD PT72
1.0000 | MEDICATED_PATCH | Freq: Once | TRANSDERMAL | Status: AC
  Administered 2020-10-29: 1.5 mg via TRANSDERMAL

## 2020-10-29 MED ORDER — STERILE WATER FOR IRRIGATION IR SOLN
Status: DC | PRN
  Administered 2020-10-29: 1000 mL

## 2020-10-29 MED ORDER — ONDANSETRON HCL 4 MG/2ML IJ SOLN
INTRAMUSCULAR | Status: DC | PRN
  Administered 2020-10-29: 4 mg via INTRAVENOUS

## 2020-10-29 MED ORDER — SODIUM CHLORIDE 0.9% FLUSH: 3.0000 mL | INTRAVENOUS | Status: DC | PRN

## 2020-10-29 MED ORDER — SODIUM CHLORIDE 0.9 % IR SOLN
Status: DC | PRN
  Administered 2020-10-29: 1000 mL

## 2020-10-29 MED ORDER — TETANUS-DIPHTH-ACELL PERTUSSIS 5-2.5-18.5 LF-MCG/0.5 IM SUSY: 0.5000 mL | PREFILLED_SYRINGE | Freq: Once | INTRAMUSCULAR | Status: DC

## 2020-10-29 MED ORDER — MEASLES, MUMPS & RUBELLA VAC IJ SOLR: 0.5000 mL | Freq: Once | INTRAMUSCULAR | Status: DC

## 2020-10-29 MED ORDER — LACTATED RINGERS IV SOLN: INTRAVENOUS | Status: DC | PRN

## 2020-10-29 MED ORDER — ENOXAPARIN SODIUM 80 MG/0.8ML IJ SOSY
0.5000 mg/kg | PREFILLED_SYRINGE | INTRAMUSCULAR | Status: DC
  Administered 2020-10-30 – 2020-11-01 (×3): 65 mg via SUBCUTANEOUS
  Filled 2020-10-29 (×3): qty 0.8

## 2020-10-29 MED ORDER — WITCH HAZEL-GLYCERIN EX PADS: 1.0000 "application " | MEDICATED_PAD | CUTANEOUS | Status: DC | PRN

## 2020-10-29 MED ORDER — OXYCODONE HCL 5 MG PO TABS
5.0000 mg | ORAL_TABLET | ORAL | Status: DC | PRN
  Administered 2020-10-30: 5 mg via ORAL
  Administered 2020-10-31 – 2020-11-01 (×5): 10 mg via ORAL
  Filled 2020-10-29 (×5): qty 2
  Filled 2020-10-29: qty 1

## 2020-10-29 MED ORDER — MEDROXYPROGESTERONE ACETATE 150 MG/ML IM SUSP: 150.0000 mg | INTRAMUSCULAR | Status: DC | PRN

## 2020-10-29 MED ORDER — FENTANYL CITRATE (PF) 100 MCG/2ML IJ SOLN
INTRAMUSCULAR | Status: DC | PRN
  Administered 2020-10-29: 15 ug via INTRATHECAL

## 2020-10-29 MED ORDER — CEFAZOLIN IN SODIUM CHLORIDE 3-0.9 GM/100ML-% IV SOLN
3.0000 g | INTRAVENOUS | Status: AC
  Administered 2020-10-29: 3 g via INTRAVENOUS

## 2020-10-29 MED ORDER — NALBUPHINE HCL 10 MG/ML IJ SOLN
INTRAMUSCULAR | Status: AC
  Filled 2020-10-29: qty 1

## 2020-10-29 MED ORDER — PRENATAL MULTIVITAMIN CH
1.0000 | ORAL_TABLET | Freq: Every day | ORAL | Status: DC
  Administered 2020-10-30 – 2020-11-01 (×3): 1 via ORAL
  Filled 2020-10-29 (×3): qty 1

## 2020-10-29 MED ORDER — NALOXONE HCL 0.4 MG/ML IJ SOLN: 0.4000 mg | INTRAMUSCULAR | Status: DC | PRN

## 2020-10-29 MED ORDER — BUPIVACAINE IN DEXTROSE 0.75-8.25 % IT SOLN
INTRATHECAL | Status: DC | PRN
  Administered 2020-10-29: 1.5 mL via INTRATHECAL

## 2020-10-29 MED ORDER — DIPHENHYDRAMINE HCL 25 MG PO CAPS: 25.0000 mg | ORAL_CAPSULE | ORAL | Status: DC | PRN

## 2020-10-29 MED ORDER — SIMETHICONE 80 MG PO CHEW
80.0000 mg | CHEWABLE_TABLET | ORAL | Status: DC | PRN
  Filled 2020-10-29: qty 1

## 2020-10-29 MED ORDER — ACETAMINOPHEN 10 MG/ML IV SOLN
INTRAVENOUS | Status: DC | PRN
  Administered 2020-10-29: 1000 mg via INTRAVENOUS

## 2020-10-29 MED ORDER — MORPHINE SULFATE (PF) 0.5 MG/ML IJ SOLN
INTRAMUSCULAR | Status: DC | PRN
  Administered 2020-10-29: 150 ug via INTRATHECAL

## 2020-10-29 MED ORDER — NALOXONE HCL 4 MG/10ML IJ SOLN
1.0000 ug/kg/h | INTRAVENOUS | Status: DC | PRN
  Filled 2020-10-29: qty 5

## 2020-10-29 MED ORDER — NALBUPHINE HCL 10 MG/ML IJ SOLN
5.0000 mg | Freq: Once | INTRAMUSCULAR | Status: AC | PRN
  Administered 2020-10-29: 5 mg via INTRAVENOUS

## 2020-10-29 MED ORDER — NALBUPHINE HCL 10 MG/ML IJ SOLN: 5.0000 mg | INTRAMUSCULAR | Status: DC | PRN

## 2020-10-29 MED ORDER — SIMETHICONE 80 MG PO CHEW
80.0000 mg | CHEWABLE_TABLET | Freq: Three times a day (TID) | ORAL | Status: DC
  Administered 2020-10-29 – 2020-11-01 (×8): 80 mg via ORAL
  Filled 2020-10-29 (×8): qty 1

## 2020-10-29 MED ORDER — FENTANYL CITRATE (PF) 100 MCG/2ML IJ SOLN: INTRAMUSCULAR | Status: DC | PRN

## 2020-10-29 MED ORDER — CEFAZOLIN IN SODIUM CHLORIDE 3-0.9 GM/100ML-% IV SOLN
INTRAVENOUS | Status: AC
  Filled 2020-10-29: qty 100

## 2020-10-29 MED ORDER — DIBUCAINE (PERIANAL) 1 % EX OINT: 1.0000 "application " | TOPICAL_OINTMENT | CUTANEOUS | Status: DC | PRN

## 2020-10-29 MED ORDER — PHENYLEPHRINE HCL-NACL 20-0.9 MG/250ML-% IV SOLN
INTRAVENOUS | Status: DC | PRN
  Administered 2020-10-29: 60 ug/min via INTRAVENOUS

## 2020-10-29 MED ORDER — ACETAMINOPHEN 10 MG/ML IV SOLN
INTRAVENOUS | Status: AC
  Filled 2020-10-29: qty 100

## 2020-10-29 MED ORDER — LACTATED RINGERS IV SOLN: INTRAVENOUS | Status: DC

## 2020-10-29 MED ORDER — NALBUPHINE HCL 10 MG/ML IJ SOLN
5.0000 mg | INTRAMUSCULAR | Status: DC | PRN
  Administered 2020-10-29: 5 mg via INTRAVENOUS
  Filled 2020-10-29: qty 1

## 2020-10-29 MED ORDER — ONDANSETRON HCL 4 MG/2ML IJ SOLN
INTRAMUSCULAR | Status: AC
  Filled 2020-10-29: qty 2

## 2020-10-29 MED ORDER — SCOPOLAMINE 1 MG/3DAYS TD PT72
MEDICATED_PATCH | TRANSDERMAL | Status: AC
  Filled 2020-10-29: qty 1

## 2020-10-29 MED ORDER — DIPHENHYDRAMINE HCL 25 MG PO CAPS
25.0000 mg | ORAL_CAPSULE | Freq: Four times a day (QID) | ORAL | Status: DC | PRN
  Administered 2020-10-30: 25 mg via ORAL
  Filled 2020-10-29: qty 1

## 2020-10-29 MED ORDER — IBUPROFEN 600 MG PO TABS
600.0000 mg | ORAL_TABLET | Freq: Four times a day (QID) | ORAL | Status: DC
  Administered 2020-10-30 – 2020-11-01 (×8): 600 mg via ORAL
  Filled 2020-10-29 (×8): qty 1

## 2020-10-29 MED ORDER — KETOROLAC TROMETHAMINE 30 MG/ML IJ SOLN
30.0000 mg | Freq: Four times a day (QID) | INTRAMUSCULAR | Status: AC
  Administered 2020-10-29 – 2020-10-30 (×4): 30 mg via INTRAVENOUS
  Filled 2020-10-29 (×4): qty 1

## 2020-10-29 MED ORDER — ONDANSETRON HCL 4 MG/2ML IJ SOLN: 4.0000 mg | Freq: Three times a day (TID) | INTRAMUSCULAR | Status: DC | PRN

## 2020-10-29 MED ORDER — SOD CITRATE-CITRIC ACID 500-334 MG/5ML PO SOLN
30.0000 mL | ORAL | Status: AC
  Administered 2020-10-29: 30 mL via ORAL

## 2020-10-29 MED ORDER — MEPERIDINE HCL 25 MG/ML IJ SOLN: 6.2500 mg | INTRAMUSCULAR | Status: DC | PRN

## 2020-10-29 MED ORDER — DIPHENHYDRAMINE HCL 50 MG/ML IJ SOLN: 12.5000 mg | INTRAMUSCULAR | Status: DC | PRN

## 2020-10-29 MED ORDER — OXYTOCIN-SODIUM CHLORIDE 30-0.9 UT/500ML-% IV SOLN
INTRAVENOUS | Status: DC | PRN
  Administered 2020-10-29: 30 [IU] via INTRAVENOUS

## 2020-10-29 MED ORDER — SOD CITRATE-CITRIC ACID 500-334 MG/5ML PO SOLN
ORAL | Status: AC
  Filled 2020-10-29: qty 30

## 2020-10-29 MED ORDER — OXYTOCIN-SODIUM CHLORIDE 30-0.9 UT/500ML-% IV SOLN: 2.5000 [IU]/h | INTRAVENOUS | Status: AC

## 2020-10-29 MED ORDER — SENNOSIDES-DOCUSATE SODIUM 8.6-50 MG PO TABS
2.0000 | ORAL_TABLET | Freq: Every day | ORAL | Status: DC
  Administered 2020-10-30: 2 via ORAL
  Filled 2020-10-29 (×3): qty 2

## 2020-10-29 MED ORDER — FENTANYL CITRATE (PF) 100 MCG/2ML IJ SOLN
INTRAMUSCULAR | Status: AC
  Filled 2020-10-29: qty 2

## 2020-10-29 MED ORDER — MENTHOL 3 MG MT LOZG: 1.0000 | LOZENGE | OROMUCOSAL | Status: DC | PRN

## 2020-10-29 SURGICAL SUPPLY — 35 items
BENZOIN TINCTURE PRP APPL 2/3 (GAUZE/BANDAGES/DRESSINGS) ×2 IMPLANT
CLOSURE STERI STRIP 1/2 X4 (GAUZE/BANDAGES/DRESSINGS) ×2 IMPLANT
CLOTH BEACON ORANGE TIMEOUT ST (SAFETY) ×2 IMPLANT
DRESSING PREVENA PLUS CUSTOM (GAUZE/BANDAGES/DRESSINGS) ×1 IMPLANT
DRSG OPSITE POSTOP 4X10 (GAUZE/BANDAGES/DRESSINGS) ×2 IMPLANT
DRSG PREVENA PLUS CUSTOM (GAUZE/BANDAGES/DRESSINGS) ×2
ELECT REM PT RETURN 9FT ADLT (ELECTROSURGICAL) ×2
ELECTRODE REM PT RTRN 9FT ADLT (ELECTROSURGICAL) ×1 IMPLANT
EXTENDER TRAXI PANNICULUS (MISCELLANEOUS) ×1 IMPLANT
EXTRACTOR VACUUM KIWI (MISCELLANEOUS) ×2 IMPLANT
GLOVE BIOGEL PI IND STRL 7.0 (GLOVE) ×1 IMPLANT
GLOVE BIOGEL PI INDICATOR 7.0 (GLOVE) ×1
GLOVE SURG ORTHO 8.0 STRL STRW (GLOVE) ×2 IMPLANT
GOWN STRL REUS W/TWL LRG LVL3 (GOWN DISPOSABLE) ×4 IMPLANT
KIT ABG SYR 3ML LUER SLIP (SYRINGE) IMPLANT
NEEDLE HYPO 25X5/8 SAFETYGLIDE (NEEDLE) IMPLANT
NS IRRIG 1000ML POUR BTL (IV SOLUTION) ×2 IMPLANT
PACK C SECTION WH (CUSTOM PROCEDURE TRAY) ×2 IMPLANT
PAD OB MATERNITY 4.3X12.25 (PERSONAL CARE ITEMS) ×2 IMPLANT
PENCIL SMOKE EVAC W/HOLSTER (ELECTROSURGICAL) ×2 IMPLANT
RETRACTOR TRAXI PANNICULUS (MISCELLANEOUS) ×1 IMPLANT
RTRCTR C-SECT PINK 25CM LRG (MISCELLANEOUS) IMPLANT
STRIP CLOSURE SKIN 1/2X4 (GAUZE/BANDAGES/DRESSINGS) ×2 IMPLANT
SUT MON AB-0 CT1 36 (SUTURE) ×6 IMPLANT
SUT PLAIN 0 NONE (SUTURE) IMPLANT
SUT VIC AB 0 CT1 27 (SUTURE) ×2
SUT VIC AB 0 CT1 27XBRD ANBCTR (SUTURE) ×2 IMPLANT
SUT VIC AB 2-0 CT1 27 (SUTURE) ×1
SUT VIC AB 2-0 CT1 TAPERPNT 27 (SUTURE) ×1 IMPLANT
SUT VIC AB 4-0 KS 27 (SUTURE) ×2 IMPLANT
TOWEL OR 17X24 6PK STRL BLUE (TOWEL DISPOSABLE) ×2 IMPLANT
TRAXI PANNICULUS EXTENDER (MISCELLANEOUS) ×1
TRAXI PANNICULUS RETRACTOR (MISCELLANEOUS) ×1
TRAY FOLEY W/BAG SLVR 14FR LF (SET/KITS/TRAYS/PACK) ×2 IMPLANT
WATER STERILE IRR 1000ML POUR (IV SOLUTION) ×2 IMPLANT

## 2020-10-29 NOTE — Discharge Summary (Addendum)
Postpartum Discharge Summary     Patient Name: Monica Jimenez DOB: 02/16/1991 MRN: [id number]  Date of admission: 10/29/2020 Delivery date:10/29/2020  Delivering provider: Griffin Basil  Date of discharge: 11/01/2020  Admitting diagnosis: History of cesarean delivery [Z98.891] Cesarean delivery delivered [O82] Intrauterine pregnancy: [redacted]w[redacted]d    Secondary diagnosis:  Principal Problem:   Cesarean delivery delivered Active Problems:   History of cesarean section   [place] 50.0-59.9[place]  Additional problems: Acute blood loss anemia postoperatively - IV Venofer given    Discharge diagnosis: Term Pregnancy Delivered                                              Post partum procedures: Bilateral tubal ligation Augmentation: N/A Complications: None  Hospital course: Sceduled C/S & BTL. 30y.o. yo [id number] 31w3das admitted to the hospital 10/29/2020 for scheduled cesarean section with the following indication: Elective Repeat. Patient also had a BTL performed at time of C-section. Delivery details are as follows:  Membrane Rupture Time/Date: 10:09 AM ,10/29/2020   Delivery Method:C-Section, Vacuum Assisted  Details of operation can be found in separate operative note.  Patient had an uncomplicated postpartum course.  She is ambulating, tolerating a regular diet, passing flatus, and urinating well. Patient is discharged home in stable condition on  11/01/20        Newborn Data: Birth date:10/29/2020  Birth time:10:09 AM  Gender:Female  Living status:Living  Apgars:7 ,7 , 9 Weight:3285 g     Magnesium Sulfate received: No BMZ received: No Rhophylac: N/A MMR: Varicella immune; unknown Rubella status T-DaP: Declined Flu: N/A Transfusion: No  Physical exam  Vitals:   10/31/20 0540 10/31/20 1456 10/31/20 2023 11/01/20 0442  BP: 118/64 121/60 124/64 91/63  Pulse: 84 75 87 (!) 59  Resp: '20 18 16 16  ' Temp: 98 F (36.7 C) 98.7 F (37.1 C) 98.8 F (37.1 C) 98.7 F (37.1 C)   TempSrc:  Oral Oral Oral  SpO2: 100% 100% 100% 100%  Weight:      Height:       General: alert, cooperative, and no distress Lochia: appropriate Uterine Fundus: firm Incision: healing well with no significant drainage, no significant erythema, dressing is clean, dry, and intact, wound vac in place DVT Evaluation: no LE edema or calf tenderness to palpation   Labs: Lab Results  Component Value Date   WBC 9.0 10/30/2020   HGB 8.1 (L) 10/30/2020   HCT 25.4 (L) 10/30/2020   MCV 94.8 10/30/2020   PLT 170 10/30/2020   CMP Latest Ref Rng & Units 09/11/2020  Glucose 70 - 99 mg/dL 91  BUN 6 - 20 mg/dL 8  Creatinine 0.44 - 1.00 mg/dL 0.49  Sodium 135 - 145 mmol/L 138  Potassium 3.5 - 5.1 mmol/L 3.9  Chloride 98 - 111 mmol/L 108  CO2 22 - 32 mmol/L 22  Calcium 8.9 - 10.3 mg/dL 8.3(L)  Total Protein 6.5 - 8.1 g/dL 5.9(L)  Total Bilirubin 0.3 - 1.2 mg/dL 0.8  Alkaline Phos 38 - 126 U/L 121  AST 15 - 41 U/L 13(L)  ALT 0 - 44 U/L 9   Edinburgh Score: Edinburgh Postnatal Depression Scale Screening Tool 10/30/2020  I have been able to laugh and see the funny side of things. 0  I have looked forward with enjoyment to things. 0  I have blamed  myself unnecessarily when things went wrong. 2  I have been anxious or worried for no good reason. 0  I have felt scared or panicky for no good reason. 0  Things have been getting on top of me. 1  I have been so unhappy that I have had difficulty sleeping. 0  I have felt sad or miserable. 0  I have been so unhappy that I have been crying. 0  The thought of harming myself has occurred to me. 0  Edinburgh Postnatal Depression Scale Total 3     After visit meds:  Allergies as of 11/01/2020   No Known Allergies      Medication List     STOP taking these medications    cyclobenzaprine 10 MG tablet Commonly known as: FLEXERIL   lidocaine 5 % Commonly known as: LIDODERM   traMADol 50 MG tablet Commonly known as: ULTRAM       TAKE  these medications    acetaminophen 325 MG tablet Commonly known as: TYLENOL Take 2 tablets (650 mg total) by mouth every 6 (six) hours as needed for mild pain, moderate pain or headache.   coconut oil Oil Apply 1 application topically as needed.   ibuprofen 600 MG tablet Commonly known as: ADVIL Take 1 tablet (600 mg total) by mouth every 6 (six) hours.   oxyCODONE 5 MG immediate release tablet Commonly known as: Oxy IR/ROXICODONE Take 1 tablet (5 mg total) by mouth every 4 (four) hours as needed for moderate pain.   simethicone 80 MG chewable tablet Commonly known as: MYLICON Chew 1 tablet (80 mg total) by mouth 3 (three) times daily after meals.               Discharge Care Instructions  (From admission, onward)           Start     Ordered   11/01/20 0000  Discharge wound care:       Comments: Keep your dressing in place until your visit for an incision check at the clinic.   11/01/20 1316            Discharge home in stable condition Infant Feeding: Bottle Infant Disposition: home with mother Discharge instruction: per After Visit Summary and Postpartum booklet. Activity: Advance as tolerated. Pelvic rest for 6 weeks.  Diet: routine diet Future Appointments: Future Appointments  Date Time Provider [place]  11/05/2020  9:00 AM [facility] NURSE [facility]  11/29/2020  8:55 AM Caren Macadam, MD [facility]   Follow up Visit: Please schedule this patient for a In person postpartum visit in 4 weeks with the following provider: Any provider. Additional Postpartum F/U: Incision check 1 week  High risk pregnancy complicated by: Hx CS, obesity Delivery mode: C-Section, Vacuum Assisted  Anticipated Birth Control: BTL done PP  GME ATTESTATION:  I saw and evaluated the patient. I agree with the findings and the plan of care as documented in the resident's note. I have made changes to documentation as necessary.   Vilma Meckel, MD OB  Fellow, [place] for [facility] 11/01/2020 2:22 PM

## 2020-10-29 NOTE — Op Note (Signed)
Monica Jimenez PROCEDURE DATE: 10/29/2020  PREOPERATIVE DIAGNOSES: Intrauterine pregnancy at [redacted]w[redacted]d weeks gestation; previous uterine incision , multiparity desiring permanent sterility  POSTOPERATIVE DIAGNOSES: The same  PROCEDURE: Repeat Low Transverse Cesarean Section  SURGEON:  Dr. Mariel Aloe  ASSISTANT:  Evalina Field, MD  ANESTHESIOLOGY TEAM: Anesthesiologist: Bethena Midget, MD CRNA: Laruth Bouchard., CRNA; Renford Dills, CRNA  INDICATIONS: Monica Jimenez is a 30 y.o. [phone] at [redacted]w[redacted]d here for cesarean section secondary to the indications listed under preoperative diagnoses; please see preoperative note for further details.  The risks of surgery were discussed with the patient including but were not limited to: bleeding which may require transfusion or reoperation; infection which may require antibiotics; injury to bowel, bladder, ureters or other surrounding organs; injury to the fetus; need for additional procedures including hysterectomy in the event of a life-threatening hemorrhage; formation of adhesions; placental abnormalities wth subsequent pregnancies; incisional problems; thromboembolic phenomenon and other postoperative/anesthesia complications.  The patient concurred with the proposed plan, giving informed written consent for the procedure.    FINDINGS:  Viable female infant in cephalic presentation.  Apgars 7 and 7 and 9 at 10 minutes.  Clear amniotic fluid.  Intact placenta, three vessel cord.  Normal uterus, fallopian tubes and ovaries bilaterally.  Minimal adhesive disease.  ANESTHESIA: Spinal  INTRAVENOUS FLUIDS: 1500 ml   ESTIMATED BLOOD LOSS: 366 ml URINE OUTPUT:  75 ml SPECIMENS: Placenta sent to L&D COMPLICATIONS: None immediate  PROCEDURE IN DETAIL:  The patient preoperatively received intravenous antibiotics and had sequential compression devices applied to her lower extremities.  She was then taken to the operating room where spinal anesthesia was  administered in sterile fashion and was found to be adequate. She was then placed in a dorsal supine position with a leftward tilt, and prepped and draped in a sterile manner.  A traxxi pannis retractor was placed and secured. A foley catheter was placed into her bladder and attached to constant gravity.  After an adequate timeout was performed, a Pfannenstiel skin incision was made with scalpel two fingerbreaths above the pubic symphasis on her preexisting scarand carried through to the underlying layer of fascia. The fascia was incised in the midline, and this incision was extended bilaterally using the Mayo scissors.  Kocher clamps x 2 were applied to the superior aspect of the fascial incision and the underlying rectus muscles were dissected off bluntly and sharply.  A similar process was carried out on the inferior aspect of the fascial incision. The rectus muscles were separated in the midline and the peritoneum was entered bluntly. The Alexis self-retaining retractor was introduced into the abdominal cavity.  The vesicouterine peritoneum was identified and grasped using smooth pickups.  It was incised and extended laterally using the Metzenbaum scissors.    Attention was turned to the lower uterine segment where a low transverse hysterotomy was made with a scalpel and extended bilaterally bluntly.  The infant was successfully delivered, the cord was clamped and cut after one minute, and the infant was handed over to the awaiting neonatology team. Uterine massage was then administered, and the placenta delivered intact with a three-vessel cord. The uterus was then cleared of clots and debris using manual curettage.  The uterine incision was closed with 0 monocryl in a running locked fashion, and an imbricating layer was also placed with 0 monocryl.  Figure-of-eight 0 monocryl serosal stitches were placed to help with hemostasis.    [place] The patient's left fallopian tube was then identified, and the  Babcock clamp was then used to grasp the tube approximately 3 cm from the cornual region. A 3 cm segment of the tube was then doubly ligated with free tie of plain gut suture, transected and excised. Good hemostasis was noted. The right fallopian tube was then identified, doubly ligated, and a 3 cm segment excised in a similar fashion allowing for bilateral tubal sterilization. Excellent hemostasis was noted.   The pelvis was cleared of all clot and debris with irrigation and suction. Hemostasis was confirmed on all surfaces.  The retractor was removed.  The peritoneum was closed with a 2-0 Vicryl running stitch . The fascia was then closed using 0 Vicryl in a running fashion.  The subcutaneous layer was irrigated and reapproximated with 2-0 vicryl interrupted stitches, and the skin was closed with a 4-0 Vicryl subcuticular stitch. The patient tolerated the procedure well. Sponge, instrument and needle counts were correct x 3.  A postpartum wound vacuum was applied to aid with healing.  She was taken to the recovery room in stable condition.    Mariel Aloe, MD, FACOG Obstetrician & Gynecologist, [facility] for [facility], [facility]

## 2020-10-29 NOTE — Transfer of Care (Signed)
Immediate Anesthesia Transfer of Care Note  Patient: Monica Jimenez  Procedure(s) Performed: CESAREAN SECTION WITH BILATERAL TUBAL LIGATION (Bilateral: Abdomen)  Patient Location: PACU  Anesthesia Type:Spinal  Level of Consciousness: awake  Airway & Oxygen Therapy: Patient Spontanous Breathing  Post-op Assessment: Report given to RN and Post -op Vital signs reviewed and stable  Post vital signs: Reviewed and stable  Last Vitals:  Vitals Value Taken Time  BP 100/53 10/29/20 1127  Temp    Pulse 57 10/29/20 1129  Resp 12 10/29/20 1129  SpO2 100 % 10/29/20 1129  Vitals shown include unvalidated device data.  Last Pain:  Vitals:   10/29/20 0800  TempSrc: Oral  PainSc: 0-No pain         Complications: No notable events documented.

## 2020-10-29 NOTE — Anesthesia Preprocedure Evaluation (Signed)
Anesthesia Evaluation  Patient identified by MRN, date of birth, ID band Patient awake    Reviewed: Allergy & Precautions, H&P , NPO status , Patient's Chart, lab work & pertinent test results  Airway Mallampati: III  TM Distance: >3 FB Neck ROM: Full    Dental no notable dental hx. (+) Teeth Intact   Pulmonary neg pulmonary ROS, former smoker,    Pulmonary exam normal breath sounds clear to auscultation       Cardiovascular Exercise Tolerance: Good hypertension, Pt. on medications negative cardio ROS Normal cardiovascular exam Rhythm:Regular Rate:Normal     Neuro/Psych negative neurological ROS  negative psych ROS   GI/Hepatic   Endo/Other  Morbid obesity  Renal/GU      Musculoskeletal   Abdominal (+) + obese,   Peds  Hematology  (+) Blood dyscrasia, anemia ,   Anesthesia Other Findings   Reproductive/Obstetrics (+) Pregnancy                             Anesthesia Physical  Anesthesia Plan  ASA: 3  Anesthesia Plan: Epidural and Spinal   Post-op Pain Management:    Induction:   PONV Risk Score and Plan: 2 and Ondansetron, Treatment may vary due to age or medical condition and Scopolamine patch - Pre-op  Airway Management Planned: Nasal Cannula and Natural Airway  Additional Equipment: None  Intra-op Plan:   Post-operative Plan:   Informed Consent: I have reviewed the patients History and Physical, chart, labs and discussed the procedure including the risks, benefits and alternatives for the proposed anesthesia with the patient or authorized representative who has indicated his/her understanding and acceptance.       Plan Discussed with: Anesthesiologist and CRNA  Anesthesia Plan Comments:         Anesthesia Quick Evaluation

## 2020-10-29 NOTE — H&P (Signed)
OBSTETRIC ADMISSION HISTORY AND PHYSICAL  Monica Jimenez is a 30 y.o. female [phone] with IUP at [redacted]w[redacted]d by LMP presenting for scheduled elective repeat CS. She reports +FMs, no LOF, no VB, no blurry vision, headaches, peripheral edema, or RUQ pain. She plans on bottle feeding. She request BTL for birth control (consent signed 09/29/20).  She received her prenatal care at [facility].  Dating: By LMP --->  Estimated Date of Delivery: 11/02/20  Sono:   @[redacted]w[redacted]d , CWD, normal anatomy, cephalic presentation, posterior placental lie, 2837g, 49% EFW  Prenatal History/Complications:  Obesity (BMI 53.9) Hx of CS (NRFHT)  Past Medical History: Past Medical History:  Diagnosis Date   Anemia    Gallstones    Hemorrhoids    HPV in female 2014   Hypertension    Obesity, morbid, BMI 50 or higher (HCC)    Trichomoniasis    Vaginal Pap smear, abnormal     Past Surgical History: Past Surgical History:  Procedure Laterality Date   CESAREAN SECTION N/A 04/17/2018   Procedure: CESAREAN SECTION;  Surgeon: 06/16/2018, DO;  Location: [facility];  Service: Obstetrics;  Laterality: N/A;   EXCISION VAGINAL CYST      Obstetrical History: OB History     Gravida  4   Para  3   Term  3   Preterm  0   AB  0   Living  3      SAB  0   IAB  0   Ectopic  0   Multiple  0   Live Births  3           Social History Social History   Socioeconomic History   Marital status: Single    Spouse name: Not on file   Number of children: Not on file   Years of education: Not on file   Highest education level: Not on file  Occupational History   Not on file  Tobacco Use   Smoking status: Former   Smokeless tobacco: Never   Tobacco comments:    marijuana, last 2 yrs ago  Vaping Use   Vaping Use: Former   Substances: Flavoring  Substance and Sexual Activity   Alcohol use: No    Comment: Ocassionaly   Drug use: Not Currently    Types: Marijuana   Sexual activity: Yes     Birth control/protection: None  Other Topics Concern   Not on file  Social History Narrative   Not on file   Social Determinants of Health   Financial Resource Strain: Not on file  Food Insecurity: No Food Insecurity   Worried About Running Out of Food in the Last Year: Never true   Ran Out of Food in the Last Year: Never true  Transportation Needs: No Transportation Needs   Lack of Transportation (Medical): No   Lack of Transportation (Non-Medical): No  Physical Activity: Not on file  Stress: Not on file  Social Connections: Not on file    Family History: Family History  Problem Relation Age of Onset   Hypertension Mother    Stroke Mother    Stroke Father    Diabetes Maternal Grandmother    Alcohol abuse Neg Hx    Arthritis Neg Hx    Asthma Neg Hx    Birth defects Neg Hx    Cancer Neg Hx    COPD Neg Hx    Depression Neg Hx    Drug abuse Neg Hx    Early death Neg  Hx    Hearing loss Neg Hx    Heart disease Neg Hx    Hyperlipidemia Neg Hx    Kidney disease Neg Hx    Learning disabilities Neg Hx    Mental illness Neg Hx    Mental retardation Neg Hx    Miscarriages / Stillbirths Neg Hx    Vision loss Neg Hx    Varicose Veins Neg Hx     Allergies: No Known Allergies  Medications Prior to Admission  Medication Sig Dispense Refill Last Dose   cyclobenzaprine (FLEXERIL) 10 MG tablet Take 1 tablet (10 mg total) by mouth 2 (two) times daily as needed for muscle spasms. (Patient not taking: No sig reported) 20 tablet 0 Not Taking   lidocaine (LIDODERM) 5 % Place 1 patch onto the skin daily as needed (pain). Remove & Discard patch within 12 hours or as directed by MD (Patient not taking: No sig reported) 14 patch 0 Not Taking   traMADol (ULTRAM) 50 MG tablet Take 1 tablet (50 mg total) by mouth every 6 (six) hours as needed for moderate pain. 15 tablet 0 More than a month    Review of Systems  All systems reviewed and negative except as stated in HPI  Blood pressure  140/83, pulse 97, temperature 98.2 F (36.8 C), temperature source Oral, resp. rate 16, height 5\' 1"  (1.549 m), weight 130 kg, last menstrual period 01/27/2020, unknown if currently breastfeeding.  General appearance: alert, cooperative, and no distress Lungs: clear to auscultation bilaterally Heart: regular rate and rhythm Abdomen: gravid, nontender to palpation  Extremities: no LE edema or calf tenderness to palpation  Prenatal labs: ABO, Rh: --/--/O POS (08/17 05-02-1976) Antibody: NEG (08/17 0927) Rubella:   RPR: NON REACTIVE (08/17 0923)  HBsAg: Negative (02/15 0000)  HIV: Non-reactive (02/15 0000)  GBS: Positive/-- (08/01 1127)  1 hr Glucola passed (113) Genetic screening unknown Anatomy 11-27-1975 normal   Prenatal Transfer Tool  Maternal Diabetes: No Genetic Screening: Unknown  Maternal Ultrasounds/Referrals: Normal Fetal Ultrasounds or other Referrals:  Referred to Materal Fetal Medicine  Maternal Substance Abuse:  No Significant Maternal Medications:  None Significant Maternal Lab Results: Group B Strep positive  No results found for this or any previous visit (from the past 24 hour(s)).  Patient Active Problem List   Diagnosis Date Noted   History of cesarean delivery 10/25/2020   Supervision of high risk pregnancy, antepartum 09/29/2020   History of cesarean section 09/29/2020   BMI 50.0-59.9, adult (HCC) 09/29/2020   Uterine contractions during pregnancy 07/27/2016   Palpitations 06/29/2016    Assessment/Plan:  Ophie Jimenez is a 30 y.o. G4P3003 at [redacted]w[redacted]d here for schedule repeat CS and BTL.   The risks of cesarean section were discussed with the patient including but were not limited to: bleeding which may require transfusion or reoperation; infection which may require antibiotics; injury to bowel, bladder, ureters or other surrounding organs; injury to the fetus; need for additional procedures including hysterectomy in the event of a life-threatening hemorrhage;  formation of adhesions; placental abnormalities wth subsequent pregnancies; incisional problems; thromboembolic phenomenon and other postoperative/anesthesia complications.  Patient also desires permanent sterilization.  Other reversible forms of contraception were discussed with patient; she declines all other modalities.  Risks of procedure discussed with patient including but not limited to: risk of regret, permanence of method, bleeding, infection, injury to surrounding organs and need for additional procedures.  Failure risk of about 1% with increased risk of ectopic gestation if pregnancy occurs was  also discussed with patient.  Also discussed possibility of post-tubal pain syndrome. The patient concurred with the proposed plan, giving informed written consent for the procedures.  Patient has been NPO since last night and she will remain NPO for procedure. Anesthesia and OR aware.  Preoperative prophylactic antibiotics and SCDs ordered on call to the OR.  To OR when ready.  #Pain: Spinal #ID:  GBS positive  #MOF: Bottle  #MOC: BTL  #Circ:  Yes  Worthy Rancher, MD  OB Fellow  Faculty Practice 10/29/2020, 8:50 AM

## 2020-10-29 NOTE — Anesthesia Procedure Notes (Signed)
Spinal  Patient location during procedure: OR Start time: 10/29/2020 9:38 AM End time: 10/29/2020 9:41 AM Reason for block: surgical anesthesia Staffing Anesthesiologist: Bethena Midget, MD Preanesthetic Checklist Completed: patient identified, IV checked, site marked, risks and benefits discussed, surgical consent, monitors and equipment checked, pre-op evaluation and timeout performed Spinal Block Patient position: sitting Prep: DuraPrep Patient monitoring: heart rate, cardiac monitor, continuous pulse ox and blood pressure Approach: midline Location: L2-3 Injection technique: single-shot Needle Needle type: Sprotte  Needle gauge: 24 G Needle length: 9 cm Assessment Sensory level: T4 Events: CSF return

## 2020-10-30 ENCOUNTER — Encounter (HOSPITAL_COMMUNITY): Payer: Self-pay | Admitting: Obstetrics and Gynecology

## 2020-10-30 LAB — CBC
HCT: 25.4 % — ABNORMAL LOW (ref 36.0–46.0)
Hemoglobin: 8.1 g/dL — ABNORMAL LOW (ref 12.0–15.0)
MCH: 30.2 pg (ref 26.0–34.0)
MCHC: 31.9 g/dL (ref 30.0–36.0)
MCV: 94.8 fL (ref 80.0–100.0)
Platelets: 170 10*3/uL (ref 150–400)
RBC: 2.68 MIL/uL — ABNORMAL LOW (ref 3.87–5.11)
RDW: 14 % (ref 11.5–15.5)
WBC: 9 10*3/uL (ref 4.0–10.5)
nRBC: 0 % (ref 0.0–0.2)

## 2020-10-30 LAB — BIRTH TISSUE RECOVERY COLLECTION (PLACENTA DONATION)

## 2020-10-30 MED ORDER — SODIUM CHLORIDE 0.9 % IV SOLN
500.0000 mg | Freq: Once | INTRAVENOUS | Status: AC
  Administered 2020-10-30: 500 mg via INTRAVENOUS
  Filled 2020-10-30: qty 25

## 2020-10-30 MED ORDER — ACETAMINOPHEN 325 MG PO TABS
650.0000 mg | ORAL_TABLET | Freq: Four times a day (QID) | ORAL | Status: DC | PRN
  Administered 2020-10-30 – 2020-11-01 (×3): 650 mg via ORAL
  Filled 2020-10-30 (×3): qty 2

## 2020-10-30 NOTE — Anesthesia Postprocedure Evaluation (Signed)
Anesthesia Post Note  Patient: Monica Jimenez  Procedure(s) Performed: CESAREAN SECTION WITH BILATERAL TUBAL LIGATION (Bilateral: Abdomen)     Patient location during evaluation: PACU Anesthesia Type: Spinal Level of consciousness: oriented and awake and alert Pain management: pain level controlled Vital Signs Assessment: post-procedure vital signs reviewed and stable Respiratory status: spontaneous breathing, respiratory function stable and patient connected to nasal cannula oxygen Cardiovascular status: blood pressure returned to baseline and stable Postop Assessment: no headache, no backache and no apparent nausea or vomiting Anesthetic complications: no   No notable events documented.  Last Vitals:  Vitals:   10/29/20 2321 10/30/20 0400  BP: 101/60 109/73  Pulse: 60 85  Resp: 18 18  Temp: 37.1 C 36.4 C  SpO2: 100% 100%    Last Pain:  Vitals:   10/30/20 0505  TempSrc:   PainSc: 5    Pain Goal:                   Monica Jimenez

## 2020-10-30 NOTE — Progress Notes (Signed)
POSTPARTUM PROGRESS NOTE  Post Partum Day 1  Subjective:  Monica Jimenez is a 30 y.o. W2O3785 s/p rLTCS at [redacted]w[redacted]d.  No acute events overnight.  Pt denies problems with ambulating, voiding or po intake.  She denies nausea or vomiting.  Pain is well controlled.  She has had flatus. She has not had bowel movement.  Lochia Minimal.   Objective: Blood pressure 109/73, pulse 85, temperature 97.6 F (36.4 C), temperature source Oral, resp. rate 18, height 5\' 1"  (1.549 m), weight 130 kg, last menstrual period 01/27/2020, SpO2 100 %, unknown if currently breastfeeding.  Patient Vitals for the past 24 hrs:  BP Temp Temp src Pulse Resp SpO2  10/30/20 0400 109/73 97.6 F (36.4 C) Oral 85 18 100 %  10/29/20 2321 101/60 98.7 F (37.1 C) Oral 60 18 100 %  10/29/20 2041 109/60 98.2 F (36.8 C) Oral 65 18 100 %  10/29/20 1632 (!) 99/59 97.6 F (36.4 C) Oral (!) 57 17 100 %  10/29/20 1523 (!) 100/55 97.6 F (36.4 C) Oral (!) 57 17 100 %  10/29/20 1420 (!) 99/58 -- -- (!) 55 18 100 %  10/29/20 1320 (!) 89/73 -- -- (!) 58 16 100 %  10/29/20 1300 (!) 110/96 -- -- (!) 59 (!) 26 95 %  10/29/20 1245 100/61 -- -- (!) 55 12 95 %  10/29/20 1230 (!) 98/58 -- -- (!) 54 11 95 %  10/29/20 1215 (!) 94/55 -- -- (!) 55 13 98 %   Physical Exam:  General: alert, cooperative and no distress Chest: no respiratory distress Heart:regular rate Abdomen: soft, nontender, small area of edema above surgical site that patient states was present during pregnancy and is resolving. Uterine Fundus: firm, appropriately tender DVT Evaluation: No calf swelling or tenderness Extremities: No edema Skin: warm, dry; incision clean/dry/intact  Recent Labs    10/30/20 0505  HGB 8.1*  HCT 25.4*   Assessment/Plan: Monica Jimenez is a 30 y.o. 26 s/p rLTCS at [redacted]w[redacted]d   PPD#1 - Doing well Contraception: BTL (done) Feeding: bottle Anemia: venofer ordered Dispo: Plan for discharge day 3 per pt request.   LOS: 1 day    Kimberly Nieland, [redacted]w[redacted]d, NP, 10/30/2020, 12:12 PM

## 2020-10-31 NOTE — Progress Notes (Addendum)
Subjective: Postpartum Day 2: Cesarean Delivery  Patient reports she is doing okay today. Pain is controlled. Eating and drinking well. Bleeding is less than a period. She is up and voiding without issue. She has passed flatus.   Objective: Vital signs in last 24 hours: Temp:  [98 F (36.7 C)-98.4 F (36.9 C)] 98 F (36.7 C) (08/21 0540) Pulse Rate:  [70-84] 84 (08/21 0540) Resp:  [20] 20 (08/21 0540) BP: (107-118)/(58-64) 118/64 (08/21 0540) SpO2:  [100 %] 100 % (08/21 0540)  Physical Exam:  General: alert, cooperative, appears stated age, and no distress Lochia: appropriate Uterine Fundus: firm Incision: dressing is clean, dry, intact; edema present over pannus that is overall improving DVT Evaluation: no significant LE edema or calf tenderness to palpation  Recent Labs    10/30/20 0505  HGB 8.1*  HCT 25.4*    Assessment/Plan: Status post repeat LT Cesarean section and BTL. Doing well postoperatively.   Continue current care.  #Anemia: Hgb 8.6; Venofer administered yesterday, asymptomatic.   #Rubella non immune: Needs MMR prior to discharge.   #Contraception: Patient had BTL with c/d #Feeding: Bottle; doing well #Circumcision: Desires, plan to complete prior to discharge  #Dispo: Plan for discharge POD#3  Gladys Damme 10/31/2020, 9:29 AM  GME ATTESTATION:  I saw and evaluated the patient. I agree with the findings and the plan of care as documented in the resident's note. I have made changes to documentation as necessary.  Vilma Meckel, MD OB Fellow, [place] [facility] [place] 10/31/2020 10:32 AM

## 2020-11-01 ENCOUNTER — Encounter: Payer: Self-pay | Admitting: Family Medicine

## 2020-11-01 DIAGNOSIS — Z9851 Tubal ligation status: Secondary | ICD-10-CM | POA: Insufficient documentation

## 2020-11-01 LAB — SURGICAL PATHOLOGY

## 2020-11-01 MED ORDER — COCONUT OIL OIL: 1.0000 "application " | TOPICAL_OIL | 0 refills | Status: AC | PRN

## 2020-11-01 MED ORDER — ACETAMINOPHEN 325 MG PO TABS: 650.0000 mg | ORAL_TABLET | Freq: Four times a day (QID) | ORAL | Status: AC | PRN

## 2020-11-01 MED ORDER — IBUPROFEN 600 MG PO TABS: 600.0000 mg | ORAL_TABLET | Freq: Four times a day (QID) | ORAL | 0 refills | Status: AC

## 2020-11-01 MED ORDER — OXYCODONE HCL 5 MG PO TABS: 5.0000 mg | ORAL_TABLET | ORAL | 0 refills | Status: AC | PRN

## 2020-11-01 MED ORDER — SIMETHICONE 80 MG PO CHEW: 80.0000 mg | CHEWABLE_TABLET | Freq: Three times a day (TID) | ORAL | 0 refills | Status: AC

## 2020-11-02 LAB — RUBELLA SCREEN: Rubella: 1.24 index (ref >0.99)

## 2020-11-05 ENCOUNTER — Ambulatory Visit: Payer: Medicaid Other

## 2020-11-08 ENCOUNTER — Telehealth (HOSPITAL_COMMUNITY): Payer: Self-pay

## 2020-11-08 NOTE — Telephone Encounter (Signed)
"  I'm doing good." Patient has no questions or concerns about her healing.  "He's good. Eating and gaining weight. This is my 4th baby. He sleeps in a bassinet."  RN reviewed ABC's of safe sleep with patient. Patient declines any questions or concerns about baby.  EPDS score is 1.  Marcelino Duster [facility] 11/08/2020,1743

## 2020-11-09 ENCOUNTER — Other Ambulatory Visit: Payer: Self-pay

## 2020-11-09 ENCOUNTER — Ambulatory Visit (INDEPENDENT_AMBULATORY_CARE_PROVIDER_SITE_OTHER): Payer: Medicaid Other

## 2020-11-09 VITALS — BP 135/77 | HR 45 | Wt 279.9 lb

## 2020-11-09 DIAGNOSIS — Z5189 Encounter for other specified aftercare: Secondary | ICD-10-CM

## 2020-11-09 NOTE — Progress Notes (Signed)
Incision Check Visit  Here for incision check following repeat c-section on 10/29/20. Pt reports Provena turned off yesterday. Provena dressing ans steri strips removed. Incision appears clean, dry, and intact with well approximated edges. Good wound care and s/s of infection reviewed with pt. Will follow up at [facility] appt on 11/29/20.  Irregular heart rate on monitor; HR 45 assessed manually. Per chart review, pt has history of palpitations. Pt denies any chest pain, shortness of breath, or dizziness. Reviewed with Crissie Reese, MD who states pt may follow up with PCP as needed.  Fleet Contras RN 11/09/20

## 2020-11-09 NOTE — Progress Notes (Signed)
Chart reviewed for nurse visit. Agree with plan of care.   Venora Maples, MD 11/09/20 3:08 PM

## 2020-11-29 ENCOUNTER — Ambulatory Visit: Payer: Medicaid Other | Admitting: Family Medicine

## 2023-02-14 IMAGING — US US MFM OB DETAIL+14 WK
1 series · 13 of 28 positions shown · non-contrast
Comparison: none

[Series 1: us mfm ob detail+14 wk · 88 acquisitions, 13 frames shown]
[im 4/88]
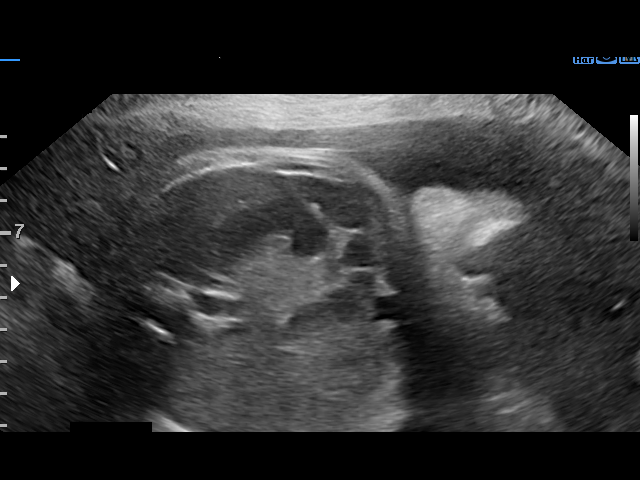
[im 10/88]
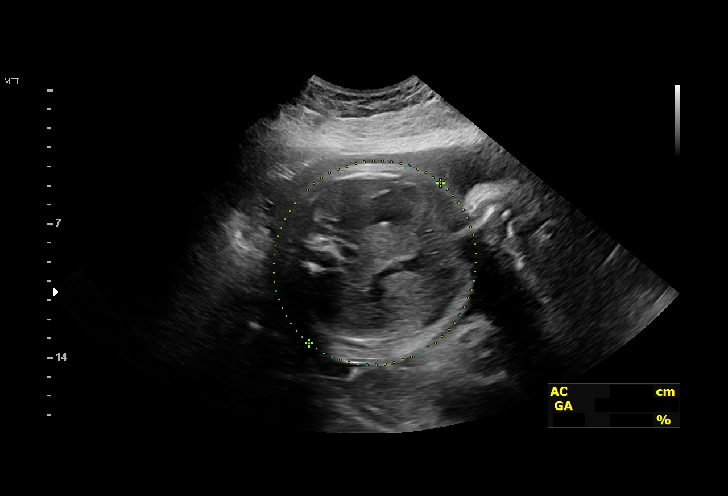
[im 17/88]
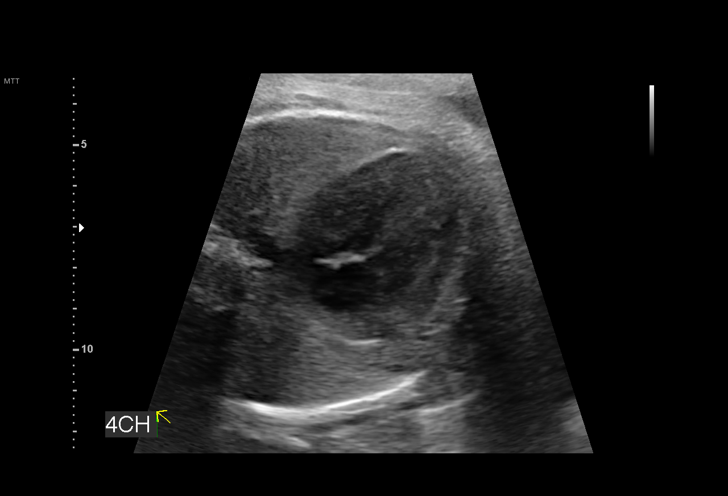
[im 23/88]
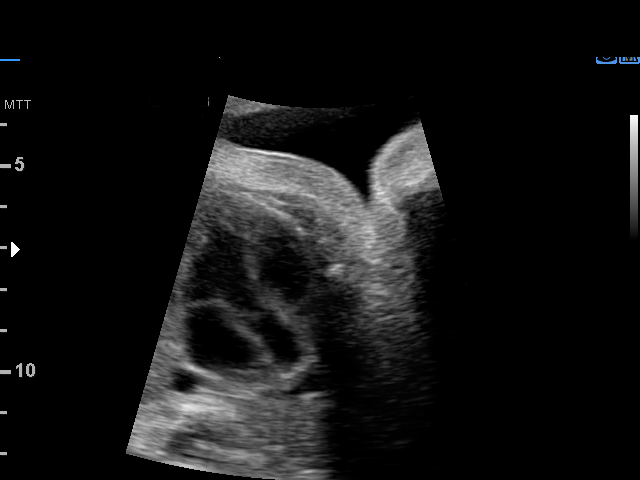
[im 30/88]
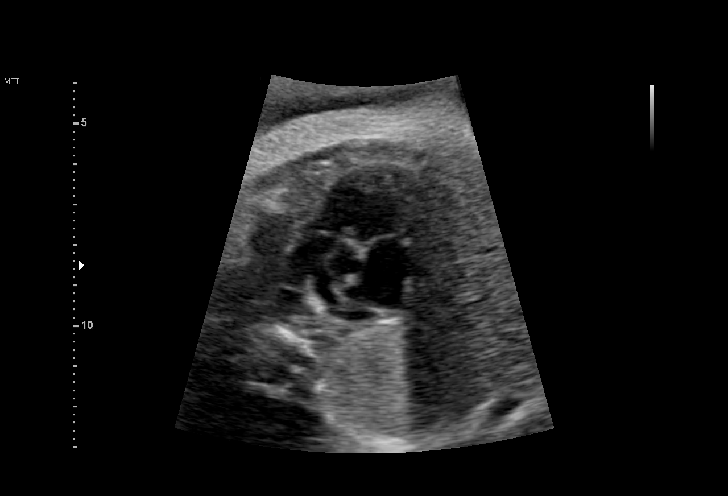
[im 36/88]
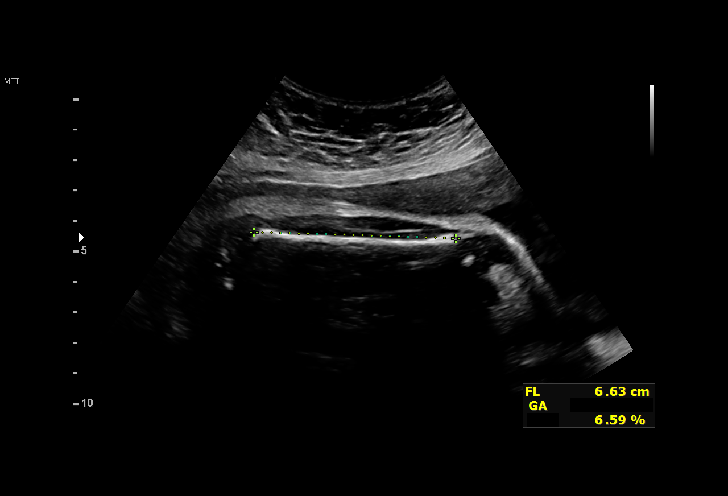
[im 46/88]
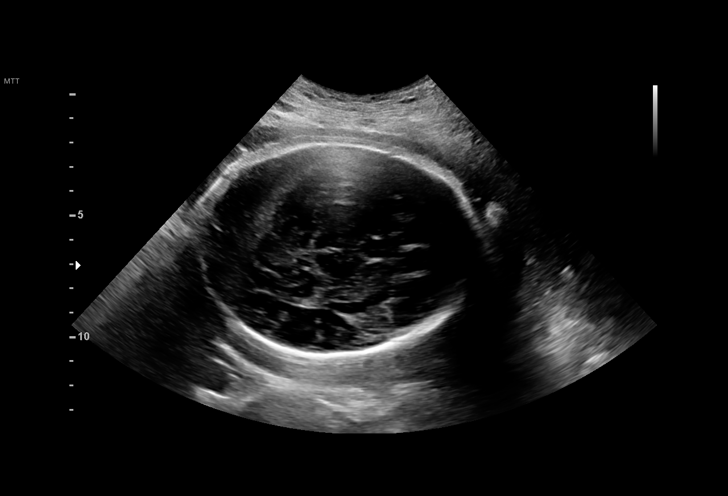
[im 52/88]
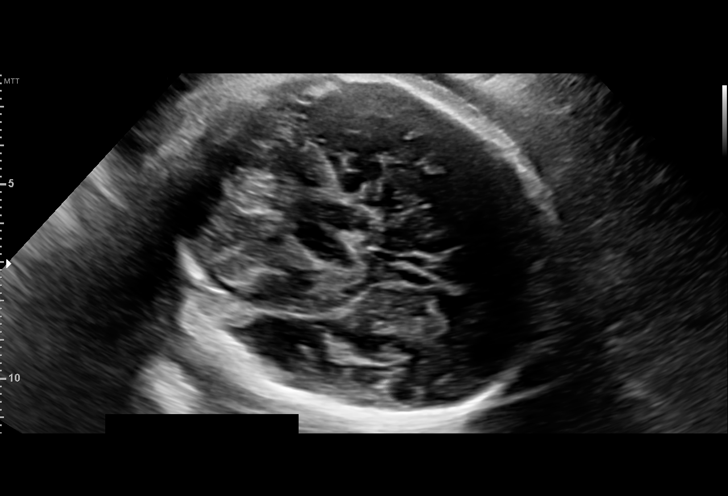
[im 59/88]
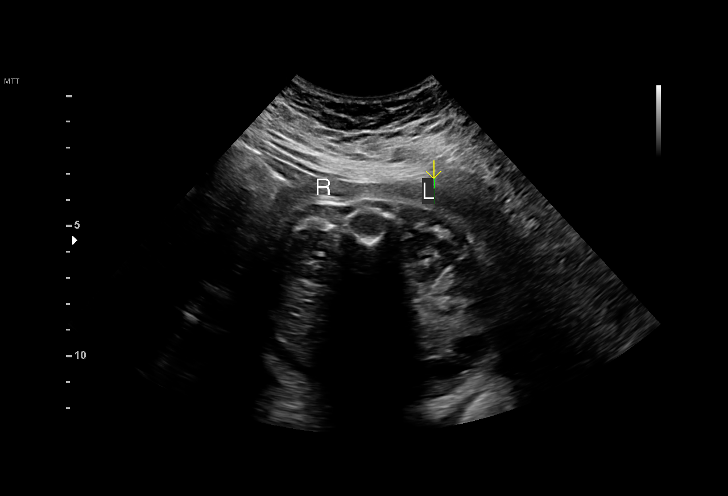
[im 65/88]
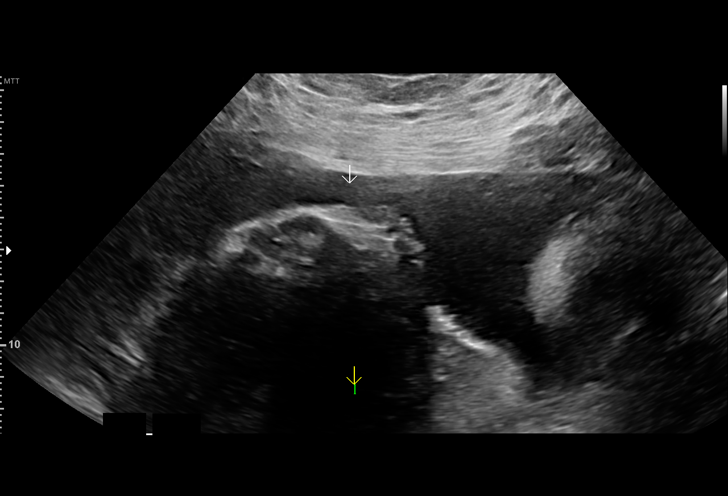
[im 71/88]
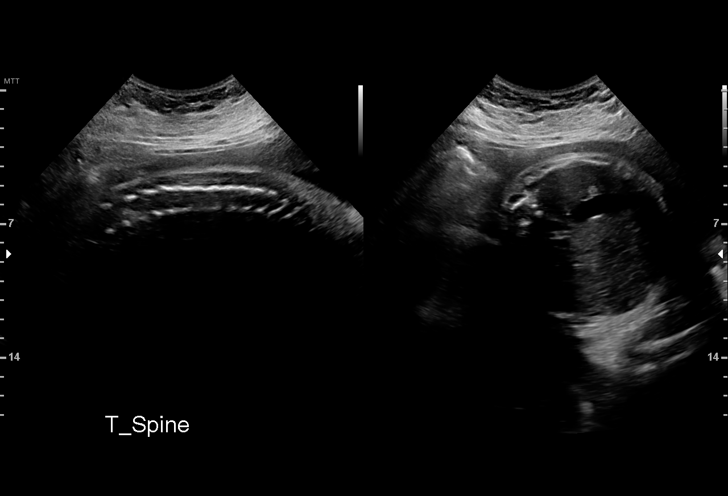
[im 78/88]
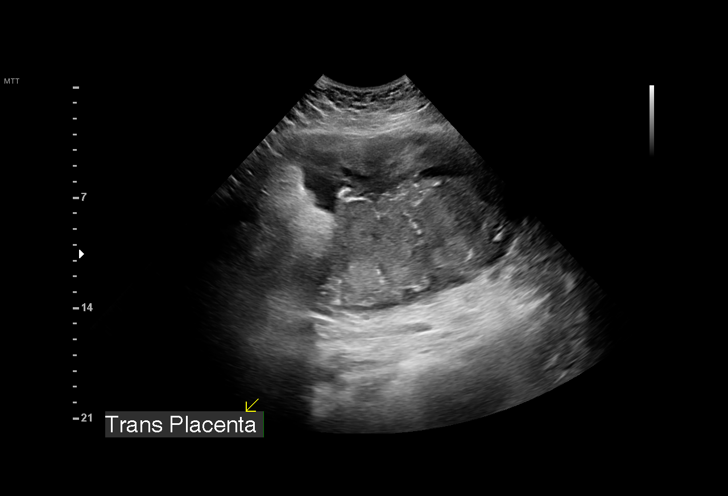
[im 84/88]
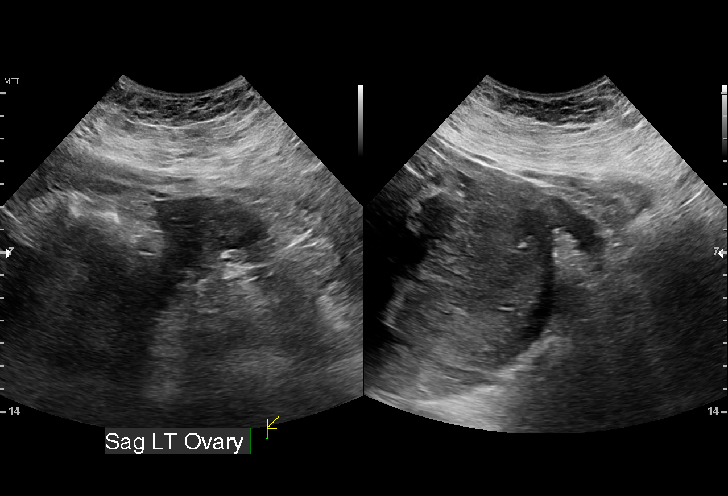

[13 of 28 positions shown; findings below may reference images not displayed]

Attending:        Carly Esquer      Secondary Phy.:   [REDACTED] Health-
                                                            Faculty Physician
                   [id number]

Indications

 36 weeks gestation of pregnancy
 Encounter for antenatal screening for
 malformations
 History of cesarean delivery, currently
 pregnant
 Obesity complicating pregnancy, third
 trimester (BMI 53)
 Unspecified maternal hypertension, third
 trimester (No meds)
 Family history of genetic disorder (Daughter
 with Albinism)
Fetal Evaluation

 Num Of Fetuses:         1
 Fetal Heart Rate(bpm):  135
 Cardiac Activity:       Observed
 Presentation:           Cephalic
 Placenta:               Posterior
 P. Cord Insertion:      Not well visualized
 Amniotic Fluid
 AFI FV:      Within normal limits

 AFI Sum(cm)     %Tile       Largest Pocket(cm)
 14.9            55          5

 RUQ(cm)       RLQ(cm)       LUQ(cm)        LLQ(cm)
 1.4           4.4           4.1            5
Biophysical Evaluation

 Amniotic F.V:   Within normal limits       F. Tone:        Observed
 F. Movement:    Observed                   Score:          [DATE]
 F. Breathing:   Observed
Biometry

 BPD:      86.3  mm     G. Age:  34w 6d         22  %    CI:        72.96   %    70 - 86
                                                         FL/HC:      20.6   %    20.1 -
 HC:      321.2  mm     G. Age:  36w 2d         23  %    HC/AC:      0.97        0.93 -
 AC:      332.8  mm     G. Age:  37w 1d         86  %    FL/BPD:     76.7   %    71 - 87
 FL:       66.2  mm     G. Age:  34w 1d          6  %    FL/AC:      19.9   %    20 - 24
 HUM:        59  mm     G. Age:  34w 1d         30  %
 CER:      48.4  mm     G. Age:  36w 2d         43  %
 LV:          4  mm

 Est. FW:    9048  gm      6 lb 4 oz     49  %
OB History

 Blood Type:   O+
 Gravidity:    4         Term:   3        Prem:   0        SAB:   0
 TOP:          0       Ectopic:  0        Living: 3
Gestational Age

 LMP:           36w 1d        Date:  01/27/20                 EDD:   11/02/20
 U/S Today:     35w 4d                                        EDD:   11/06/20
 Best:          36w 1d     Det. By:  LMP  (01/27/20)          EDD:   11/02/20
Anatomy

 Cranium:               Appears normal         LVOT:                   Appears normal
 Cavum:                 Appears normal         Aortic Arch:            Appears normal
 Ventricles:            Appears normal         Ductal Arch:            Not well visualized
 Choroid Plexus:        Appears normal         Diaphragm:              Appears normal
 Cerebellum:            Appears normal         Stomach:                Appears normal, left
                                                                       sided
 Posterior Fossa:       Not well visualized    Abdomen:                Appears normal
 Nuchal Fold:           Not applicable (>20    Abdominal Wall:         Not well visualized
                        wks GA)
 Face:                  Orbits nl; profile not Cord Vessels:           Appears normal (3
                        well visualized                                vessel cord)
 Lips:                  Appears normal         Kidneys:                Appear normal
 Palate:                Not well visualized    Bladder:                Appears normal
 Thoracic:              Appears normal         Spine:                  Limited views
                                                                       appear normal
 Heart:                 Appears normal         Upper Extremities:      Not well visualized
                        (4CH, axis, and
                        situs)
 RVOT:                  Appears normal         Lower Extremities:      Not well visualized

 Other:  Fetus appears to be a male. Technicallly difficult due to advanced
         GA, fetal position, and maternal habitus.
Cervix Uterus Adnexa

 Cervix
 Not visualized (advanced GA >31wks)

 Uterus
 No abnormality visualized.

 Right Ovary
 Not visualized.

 Left Ovary
 Within normal limits.

 Cul De Sac
 No free fluid seen.

 Adnexa
 No abnormality visualized.
Impression

 Single intrauterine pregnancy here for a detailed anatomy
 due to elevated maternal BMI
 Normal anatomy with measurements consistent with dates
 There is good fetal movement and amniotic fluid volume
 Suboptimal views of the fetal anatomy were obtained
 secondary to fetal position.

 In addition we discussed starting daily low dose ASA for the
 prevention of preeclampsia.
Recommendations

 Continue weekly testing due to elevated BMI.
